# Patient Record
Sex: Female | Born: 1955 | Race: White | Hispanic: No | Marital: Single | Smoking: Never smoker
Health system: Southern US, Community
[De-identification: ages and names within clinical notes are randomized; demographics above are authoritative.]

## PROBLEM LIST (undated history)

## (undated) DIAGNOSIS — J45909 Unspecified asthma, uncomplicated: Secondary | ICD-10-CM

## (undated) HISTORY — PX: APPENDECTOMY: SHX54

## (undated) HISTORY — PX: ABDOMINAL HYSTERECTOMY: SHX81

## (undated) HISTORY — PX: TONSILLECTOMY: SUR1361

---

## 2016-07-31 ENCOUNTER — Ambulatory Visit
Admission: EM | Admit: 2016-07-31 | Discharge: 2016-07-31 | Disposition: A | Discharge status: Home / Self Care | Payer: 59 | Attending: Family Medicine | Admitting: Family Medicine

## 2016-07-31 DIAGNOSIS — J4521 Mild intermittent asthma with (acute) exacerbation: Secondary | ICD-10-CM

## 2016-07-31 HISTORY — DX: Unspecified asthma, uncomplicated: J45.909

## 2016-07-31 MED ORDER — DOXYCYCLINE HYCLATE 100 MG PO TABS: 100.0000 mg | ORAL_TABLET | Freq: Two times a day (BID) | ORAL | 0 refills | Status: DC

## 2016-07-31 MED ORDER — PREDNISONE 20 MG PO TABS: ORAL_TABLET | ORAL | 0 refills | Status: DC

## 2016-07-31 NOTE — ED Provider Notes (Signed)
[facility]    CSN: [id number] Arrival date & time: 07/31/16  1833     History   Chief Complaint Chief Complaint  Patient presents with  . Cough    HPI Faith Huynh is a 61 y.o. female.   The history is provided by the patient.  Cough  Associated symptoms: wheezing   URI  Presenting symptoms: congestion and cough   Severity:  Moderate Onset quality:  Sudden Timing:  Constant Chronicity:  New Relieved by:  Nothing Ineffective treatments:  Prescription medications and OTC medications Associated symptoms: wheezing   Risk factors: chronic respiratory disease (asthma)   Risk factors: not elderly, no chronic cardiac disease, no chronic kidney disease, no diabetes mellitus, no immunosuppression, no recent illness, no recent travel and no sick contacts     Past Medical History:  Diagnosis Date  . Asthma     There are no active problems to display for this patient.   Past Surgical History:  Procedure Laterality Date  . ABDOMINAL HYSTERECTOMY    . APPENDECTOMY    . TONSILLECTOMY      OB History    No data available       Home Medications    Prior to Admission medications   Medication Sig Start Date End Date Taking? Authorizing Provider  albuterol (PROVENTIL HFA;VENTOLIN HFA) 108 (90 Base) MCG/ACT inhaler Inhale into the lungs every 6 (six) hours as needed for wheezing or shortness of breath.   Yes Historical Provider, MD  budesonide (PULMICORT) 180 MCG/ACT inhaler Inhale into the lungs 2 (two) times daily.   Yes Historical Provider, MD  estradiol (ESTRACE) 0.5 MG tablet Take 0.5 mg by mouth daily.   Yes Historical Provider, MD  fluticasone (FLONASE) 50 MCG/ACT nasal spray Place into both nostrils daily.   Yes Historical Provider, MD  ranitidine (ZANTAC) 150 MG tablet Take 150 mg by mouth 2 (two) times daily.   Yes Historical Provider, MD  doxycycline (VIBRA-TABS) 100 MG tablet Take 1 tablet (100 mg total) by mouth 2 (two) times daily. 07/31/16   Faith Mccallum, MD  predniSONE (DELTASONE) 20 MG tablet 3 tabs po qd for 2 days, then 2 tabs po qd for 3 days, then 1 tab po qd for 3 days, then half a tab po qd for 2 days 07/31/16   Faith Mccallum, MD    Family History History reviewed. No pertinent family history.  Social History Social History  Substance Use Topics  . Smoking status: Never Smoker  . Smokeless tobacco: Never Used  . Alcohol use Yes     Comment: social     Allergies   Biaxin [clarithromycin]; Sulfa antibiotics; and Erythromycin   Review of Systems Review of Systems  HENT: Positive for congestion.   Respiratory: Positive for cough and wheezing.      Physical Exam Triage Vital Signs ED Triage Vitals  Enc Vitals Group     BP 07/31/16 1917 (!) 167/90     Pulse Rate 07/31/16 1917 90     Resp 07/31/16 1917 18     Temp 07/31/16 1917 98.9 F (37.2 C)     Temp Source 07/31/16 1917 Oral     SpO2 07/31/16 1917 100 %     Weight 07/31/16 1918 145 lb (65.8 kg)     Height 07/31/16 1918 5\' 5"  (1.651 m)     Head Circumference --      Peak Flow --      Pain Score 07/31/16 1922 5  Pain Loc --      Pain Edu? --      Excl. in GC? --    No data found.   Updated Vital Signs BP (!) 167/90 (BP Location: Left Arm)   Pulse 90   Temp 98.9 F (37.2 C) (Oral)   Resp 18   Ht 5\' 5"  (1.651 m)   Wt 145 lb (65.8 kg)   SpO2 100%   BMI 24.13 kg/m   Visual Acuity Right Eye Distance:   Left Eye Distance:   Bilateral Distance:    Right Eye Near:   Left Eye Near:    Bilateral Near:     Physical Exam  Constitutional: She appears well-developed and well-nourished. No distress.  HENT:  Head: Normocephalic and atraumatic.  Right Ear: Tympanic membrane, external ear and ear canal normal.  Left Ear: Tympanic membrane, external ear and ear canal normal.  Nose: Mucosal edema and rhinorrhea present. No nose lacerations, sinus tenderness, nasal deformity, septal deviation or nasal septal hematoma. No epistaxis.  No foreign  bodies. Right sinus exhibits no maxillary sinus tenderness and no frontal sinus tenderness. Left sinus exhibits no maxillary sinus tenderness and no frontal sinus tenderness.  Mouth/Throat: Uvula is midline, oropharynx is clear and moist and mucous membranes are normal. No oropharyngeal exudate.  Eyes: Conjunctivae and EOM are normal. Pupils are equal, round, and reactive to light. Right eye exhibits no discharge. Left eye exhibits no discharge. No scleral icterus.  Neck: Normal range of motion. Neck supple. No thyromegaly present.  Cardiovascular: Normal rate, regular rhythm and normal heart sounds.   Pulmonary/Chest: Effort normal. No respiratory distress. She has wheezes (few, diffuse and rhonchi bilaterally). She has no rales.  Lymphadenopathy:    She has no cervical adenopathy.  Skin: She is not diaphoretic.  Nursing note and vitals reviewed.    UC Treatments / Results  Labs (all labs ordered are listed, but only abnormal results are displayed) Labs Reviewed - No data to display  EKG  EKG Interpretation None       Radiology No results found.  Procedures Procedures (including critical care time)  Medications Ordered in UC Medications - No data to display   Initial Impression / Assessment and Plan / UC Course  I have reviewed the triage vital signs and the nursing notes.  Pertinent labs & imaging results that were available during my care of the patient were reviewed by me and considered in my medical decision making (see chart for details).  Clinical Course       Final Clinical Impressions(s) / UC Diagnoses   Final diagnoses:  Mild intermittent asthmatic bronchitis with acute exacerbation    New Prescriptions Discharge Medication List as of 07/31/2016  8:01 PM    START taking these medications   Details  doxycycline (VIBRA-TABS) 100 MG tablet Take 1 tablet (100 mg total) by mouth 2 (two) times daily., Starting Wed 07/31/2016, Normal    predniSONE (DELTASONE)  20 MG tablet 3 tabs po qd for 2 days, then 2 tabs po qd for 3 days, then 1 tab po qd for 3 days, then half a tab po qd for 2 days, Normal       1. diagnosis reviewed with patient 2. rx as per orders above; reviewed possible side effects, interactions, risks and benefits  3. Recommend supportive treatment with rest, fluids; continue current home inhalers 4. Follow-up prn if symptoms worsen or don't improve   Faith Mccallumrlando Jorgina Binning, MD 07/31/16 2108

## 2016-07-31 NOTE — ED Triage Notes (Addendum)
For past week has had cough productive of green phlegm, laryngitis, drainage down back of throat, nasal and chest congestion. No fever. No sore throat. Has taken some Amoxicillin for past few days that she had left over from a previous prescription but not improving.

## 2017-03-09 ENCOUNTER — Ambulatory Visit
Admission: EM | Admit: 2017-03-09 | Discharge: 2017-03-09 | Disposition: A | Discharge status: Home / Self Care | Payer: 59 | Attending: Family Medicine | Admitting: Family Medicine

## 2017-03-09 DIAGNOSIS — R42 Dizziness and giddiness: Secondary | ICD-10-CM | POA: Diagnosis not present

## 2017-03-09 DIAGNOSIS — R51 Headache: Secondary | ICD-10-CM | POA: Diagnosis not present

## 2017-03-09 DIAGNOSIS — H6982 Other specified disorders of Eustachian tube, left ear: Secondary | ICD-10-CM

## 2017-03-09 MED ORDER — AMOXICILLIN-POT CLAVULANATE 875-125 MG PO TABS: 1.0000 | ORAL_TABLET | Freq: Two times a day (BID) | ORAL | 0 refills | Status: DC

## 2017-03-09 NOTE — ED Triage Notes (Addendum)
As per patient felt dizzy and off balance has hx of vertigo had Left ear pain sinusitis nasal congestion onset week ago got improved after sudafed and  sinus allergy Advil but getting worst from past 2 days now felt ike little dehydrated but drinking enough water.

## 2017-03-09 NOTE — ED Provider Notes (Signed)
[facility]    CSN: [id number] Arrival date & time: 03/09/17  1137     History   Chief Complaint Chief Complaint  Patient presents with  . Weakness    HPI Faith PollackRachel Huynh is a 61 y.o. female.   HPI  This a 61 year old female who presented 10 days ago had ear pain sinusitis, nasal congestion, began just prior to the [place] [facility] [place] she started feeling dizzy and off-balance. She started using Sudafed and sinus allergy Advil and improved. She returned on flight back to this area and now past 2 days has felt the dizziness and also issues with "fluid" in left ear  With pain. She is likely slightly dehydrated because [place]. She has had no fever or chills. She does have sinus pain and tenderness.          Past Medical History:  Diagnosis Date  . Asthma     There are no active problems to display for this patient.   Past Surgical History:  Procedure Laterality Date  . ABDOMINAL HYSTERECTOMY    . APPENDECTOMY    . TONSILLECTOMY      OB History    No data available       Home Medications    Prior to Admission medications   Medication Sig Start Date End Date Taking? Authorizing Provider  albuterol (PROVENTIL HFA;VENTOLIN HFA) 108 (90 Base) MCG/ACT inhaler Inhale into the lungs every 6 (six) hours as needed for wheezing or shortness of breath.    [provider]  amoxicillin-clavulanate (AUGMENTIN) 875-125 MG tablet Take 1 tablet by mouth every 12 (twelve) hours. 03/09/17   Lutricia Feiloemer, Chellie Vanlue P, PA-C  budesonide (PULMICORT) 180 MCG/ACT inhaler Inhale into the lungs 2 (two) times daily.    [provider]  doxycycline (VIBRA-TABS) 100 MG tablet Take 1 tablet (100 mg total) by mouth 2 (two) times daily. 07/31/16   Payton Mccallumonty, [place], MD  estradiol (ESTRACE) 0.5 MG tablet Take 0.5 mg by mouth daily.    [provider]  fluticasone (FLONASE) 50 MCG/ACT nasal spray Place into both nostrils daily.     [provider]  predniSONE (DELTASONE) 20 MG tablet 3 tabs po qd for 2 days, then 2 tabs po qd for 3 days, then 1 tab po qd for 3 days, then half a tab po qd for 2 days 07/31/16   Payton Mccallumonty, [place], MD  ranitidine (ZANTAC) 150 MG tablet Take 150 mg by mouth 2 (two) times daily.    [provider]    Family History No family history on file.  Social History Social History  Substance Use Topics  . Smoking status: Never Smoker  . Smokeless tobacco: Never Used  . Alcohol use Yes     Comment: social     Allergies   Biaxin [clarithromycin]; Sulfa antibiotics; and Erythromycin   Review of Systems Review of Systems  Constitutional: Positive for activity change. Negative for chills, fatigue and fever.  HENT: Positive for congestion, postnasal drip, rhinorrhea, sinus pain and sinus pressure.   Neurological: Positive for dizziness and headaches.  All other systems reviewed and are negative.    Physical Exam Triage Vital Signs ED Triage Vitals [03/09/17 1203]  Enc Vitals Group     BP (!) 144/83     Pulse Rate 82     Resp 16     Temp 97.9 F (36.6 C)     Temp Source Oral  SpO2 100 %     Weight 153 lb (69.4 kg)     Height 5\' 5"  (1.651 m)     Head Circumference      Peak Flow      Pain Score      Pain Loc      Pain Edu?      Excl. in GC?    Orthostatic VS for the past 24 hrs:  BP- Lying Pulse- Lying BP- Sitting Pulse- Sitting BP- Standing at 0 minutes Pulse- Standing at 0 minutes  03/09/17 1313 141/71 71 138/76 77 132/79 85    Updated Vital Signs BP (!) 144/83 (BP Location: Right Arm)   Pulse 82   Temp 97.9 F (36.6 C) (Oral)   Resp 16   Ht 5\' 5"  (1.651 m)   Wt 153 lb (69.4 kg)   SpO2 100%   BMI 25.46 kg/m   Visual Acuity Right Eye Distance:   Left Eye Distance:   Bilateral Distance:    Right Eye Near:   Left Eye Near:    Bilateral Near:     Physical Exam  Constitutional: She is oriented to person, place, and time. She appears  well-developed and well-nourished. No distress.  HENT:  Head: Normocephalic.  Nose: Nose normal.  Mouth/Throat: Oropharynx is clear and moist. No oropharyngeal exudate.  Left ear is dull . Right ear is impacted with cerumen  Eyes: Pupils are equal, round, and reactive to light. Right eye exhibits no discharge. Left eye exhibits no discharge.  Neck: Normal range of motion.  Pulmonary/Chest: Effort normal and breath sounds normal.  Musculoskeletal: Normal range of motion.  Lymphadenopathy:    She has no cervical adenopathy.  Neurological: She is alert and oriented to person, place, and time.  Skin: Skin is warm and dry. She is not diaphoretic.  Psychiatric: She has a normal mood and affect. Her behavior is normal. Judgment and thought content normal.  Nursing note and vitals reviewed.    UC Treatments / Results  Labs (all labs ordered are listed, but only abnormal results are displayed) Labs Reviewed - No data to display  EKG  EKG Interpretation None       Radiology No results found.  Procedures Procedures (including critical care time)  Medications Ordered in UC Medications - No data to display   Initial Impression / Assessment and Plan / UC Course  I have reviewed the triage vital signs and the nursing notes.  Pertinent labs & imaging results that were available during my care of the patient were reviewed by me and considered in my medical decision making (see chart for details). Plan: 1. Test/x-ray results and diagnosis reviewed with patient 2. rx as per orders; risks, benefits, potential side effects reviewed with patient 3. Recommend supportive treatment with continued use of Flonase. Increase fluid intake. Will treat with Augmentin because of her previous sinus symptoms with the eustachian tube dysfunction she has currently and the significant tenderness she has to percussion. She's not improving will provide her with the name of an ENT that she may follow-up  with. 4. F/u prn if symptoms worsen or don't improve       Final Clinical Impressions(s) / UC Diagnoses   Final diagnoses:  Eustachian tube dysfunction, left    New Prescriptions New Prescriptions   AMOXICILLIN-CLAVULANATE (AUGMENTIN) 875-125 MG TABLET    Take 1 tablet by mouth every 12 (twelve) hours.     Controlled Substance Prescriptions [place] Controlled Substance Registry consulted? Not Applicable  Lutricia Feil, PA-C 03/09/17 1318

## 2019-12-17 ENCOUNTER — Ambulatory Visit: Payer: Self-pay | Attending: Internal Medicine

## 2019-12-17 DIAGNOSIS — Z23 Encounter for immunization: Secondary | ICD-10-CM

## 2019-12-17 NOTE — Progress Notes (Signed)
   Covid-19 Vaccination Clinic  Name:  Faith Huynh    MRN: [id number] DOB: Dec 19, 1955  12/17/2019  Faith Huynh was observed post Covid-19 immunization for 30 minutes based on pre-vaccination screening without incident. She was provided with Vaccine Information Sheet and instruction to access the V-Safe system.   Faith Huynh was instructed to call 911 with any severe reactions post vaccine: Marland Kitchen Difficulty breathing  . Swelling of face and throat  . A fast heartbeat  . A bad rash all over body  . Dizziness and weakness   Immunizations Administered    Name Date Dose VIS Date Route   Pfizer COVID-19 Vaccine 12/17/2019  9:00 AM 0.3 mL 09/22/2018 Intramuscular   Manufacturer: [employer], [employer]   Lot: [id number]   NDC: [address]-5

## 2020-01-07 ENCOUNTER — Ambulatory Visit: Payer: Self-pay | Attending: Internal Medicine

## 2020-01-07 DIAGNOSIS — Z23 Encounter for immunization: Secondary | ICD-10-CM

## 2020-01-07 NOTE — Progress Notes (Signed)
° °  Covid-19 Vaccination Clinic  Name:  Jaileigh Weimer    MRN: [id number] DOB: 1955-09-13  01/07/2020  Ms. Byers was observed post Covid-19 immunization for 15 minutes without incident. She was provided with Vaccine Information Sheet and instruction to access the V-Safe system.   Ms. Darr was instructed to call 911 with any severe reactions post vaccine:  Difficulty breathing   Swelling of face and throat   A fast heartbeat   A bad rash all over body   Dizziness and weakness   Immunizations Administered    Name Date Dose VIS Date Route   Pfizer COVID-19 Vaccine 01/07/2020  8:30 AM 0.3 mL 09/22/2018 Intramuscular   Manufacturer: [employer], [employer]   Lot: [id number]   NDC: [address]-7

## 2020-08-20 ENCOUNTER — Ambulatory Visit (INDEPENDENT_AMBULATORY_CARE_PROVIDER_SITE_OTHER): Payer: Managed Care, Other (non HMO)

## 2020-08-20 ENCOUNTER — Other Ambulatory Visit: Payer: Self-pay

## 2020-08-20 ENCOUNTER — Encounter: Payer: Self-pay | Admitting: Emergency Medicine

## 2020-08-20 ENCOUNTER — Ambulatory Visit
Admission: EM | Admit: 2020-08-20 | Discharge: 2020-08-20 | Disposition: A | Discharge status: Home / Self Care | Payer: Managed Care, Other (non HMO)

## 2020-08-20 DIAGNOSIS — M25551 Pain in right hip: Secondary | ICD-10-CM

## 2020-08-20 DIAGNOSIS — M62838 Other muscle spasm: Secondary | ICD-10-CM

## 2020-08-20 DIAGNOSIS — W19XXXA Unspecified fall, initial encounter: Secondary | ICD-10-CM | POA: Diagnosis not present

## 2020-08-20 DIAGNOSIS — T148XXA Other injury of unspecified body region, initial encounter: Secondary | ICD-10-CM

## 2020-08-20 MED ORDER — METHOCARBAMOL 750 MG PO TABS: 750.0000 mg | ORAL_TABLET | Freq: Three times a day (TID) | ORAL | 0 refills | Status: AC | PRN

## 2020-08-20 NOTE — ED Triage Notes (Signed)
Patient states that she slipped and fell on her step on Friday.  Patient c/o pain in her right buttock and hip.  Patient denies hitting her head.

## 2020-08-20 NOTE — Discharge Instructions (Signed)
The x-ray of your hip is normal today.  You do have a very bad bruise which is consistent with a traumatic hematoma.  You should ice this area every couple of hours for the first 3 days and then switch to use of heat.  You can use a heating pad.  This will help the blood to resorb.  He can continue taking ibuprofen and add Tylenol for pain relief.  I have also sent some methocarbamol since you do appear to be having muscle spasms in the area and it is already been helping you.  Follow-up with our department as needed for any new or worsening symptoms.

## 2020-08-20 NOTE — ED Provider Notes (Signed)
[facility]    CSN: [id number] Arrival date & time: 08/20/20  1230      History   Chief Complaint Chief Complaint  Patient presents with  . Fall  . Hip Pain    right    HPI Faith Huynh is a 65 y.o. female presenting for pain of the right buttocks and hip following a fall 2 days ago.  Patient states that she slipped on her icy stairs and fell down about 2-3 stairs and landed on her right buttocks.  Patient says she has a very large bruise on her right butt cheek.  She says she has been icing the area and taking ibuprofen which has helped decrease the swelling in the area, but she is concerned that something could possibly be "cracked."  Patient also admits that she is dealing with a herniated disc in her back and sees a chiropractor for this.  She states she has some methocarbamol that she has been taking which does seem to help muscle spasms she is having in the area.  Patient says she is now out of this medication.  She denies any head injury or loss of consciousness.  No bleeding disorders.  No other injuries, complaints or concerns.  HPI  Past Medical History:  Diagnosis Date  . Asthma     There are no problems to display for this patient.   Past Surgical History:  Procedure Laterality Date  . ABDOMINAL HYSTERECTOMY    . APPENDECTOMY    . TONSILLECTOMY      OB History   No obstetric history on file.      Home Medications    Prior to Admission medications   Medication Sig Start Date End Date Taking? Authorizing Provider  albuterol (PROVENTIL HFA;VENTOLIN HFA) 108 (90 Base) MCG/ACT inhaler Inhale into the lungs every 6 (six) hours as needed for wheezing or shortness of breath.   Yes [provider]  BREO ELLIPTA 100-25 MCG/INH AEPB Inhale 1 puff into the lungs daily. 07/13/20  Yes [provider]  estradiol (ESTRACE) 0.5 MG tablet Take 0.5 mg by mouth daily.   Yes [provider]  fluticasone (FLONASE) 50 MCG/ACT nasal spray  Place into both nostrils daily.   Yes [provider]  methocarbamol (ROBAXIN) 750 MG tablet Take 1 tablet (750 mg total) by mouth every 8 (eight) hours as needed for up to 10 days for muscle spasms. 08/20/20 08/30/20 Yes Eusebio Friendly B, PA-C  amoxicillin-clavulanate (AUGMENTIN) 875-125 MG tablet Take 1 tablet by mouth every 12 (twelve) hours. 03/09/17   Lutricia Feil, PA-C  budesonide (PULMICORT) 180 MCG/ACT inhaler Inhale into the lungs 2 (two) times daily.    [provider]  doxycycline (VIBRA-TABS) 100 MG tablet Take 1 tablet (100 mg total) by mouth 2 (two) times daily. 07/31/16   [name], MD  predniSONE (DELTASONE) 20 MG tablet 3 tabs po qd for 2 days, then 2 tabs po qd for 3 days, then 1 tab po qd for 3 days, then half a tab po qd for 2 days 07/31/16   [name], MD  ranitidine (ZANTAC) 150 MG tablet Take 150 mg by mouth 2 (two) times daily.    [provider]    Family History History reviewed. No pertinent family history.  Social History Social History   Tobacco Use  . Smoking status: Never Smoker  . Smokeless tobacco: Never Used  Vaping Use  . Vaping Use: Never used  Substance Use Topics  .  Alcohol use: Yes    Comment: social  . Drug use: No     Allergies   Biaxin [clarithromycin], Sulfa antibiotics, and Erythromycin   Review of Systems Review of Systems  Constitutional: Negative for fatigue.  Musculoskeletal: Positive for arthralgias, back pain (chronic) and myalgias. Negative for joint swelling.  Skin: Positive for color change. Negative for rash and wound.  Neurological: Negative for dizziness, syncope, weakness, numbness and headaches.  Hematological: Does not bruise/bleed easily.     Physical Exam Triage Vital Signs ED Triage Vitals  Enc Vitals Group     BP 08/20/20 1243 (!) 153/76     Pulse Rate 08/20/20 1243 98     Resp 08/20/20 1243 14     Temp 08/20/20 1243 98.1 F (36.7 C)     Temp Source 08/20/20 1243 Oral      SpO2 08/20/20 1243 99 %     Weight 08/20/20 1240 160 lb (72.6 kg)     Height 08/20/20 1240 5\' 5"  (1.651 m)     Head Circumference --      Peak Flow --      Pain Score 08/20/20 1239 5     Pain Loc --      Pain Edu? --      Excl. in GC? --    No data found.  Updated Vital Signs BP (!) 153/76 (BP Location: Right Arm)   Pulse 98   Temp 98.1 F (36.7 C) (Oral)   Resp 14   Ht 5\' 5"  (1.651 m)   Wt 160 lb (72.6 kg)   SpO2 99%   BMI 26.63 kg/m       Physical Exam Vitals and nursing note reviewed.  Constitutional:      General: She is not in acute distress.    Appearance: Normal appearance. She is not ill-appearing or toxic-appearing.  HENT:     Head: Normocephalic and atraumatic.  Eyes:     General: No scleral icterus.       Right eye: No discharge.        Left eye: No discharge.     Conjunctiva/sclera: Conjunctivae normal.  Cardiovascular:     Rate and Rhythm: Normal rate and regular rhythm.     Heart sounds: Normal heart sounds.  Pulmonary:     Effort: Pulmonary effort is normal. No respiratory distress.     Breath sounds: Normal breath sounds.  Musculoskeletal:        General: Tenderness (TTP L5-S1) present.     Cervical back: Neck supple.  Skin:    General: Skin is dry.     Findings: Ecchymosis (diffuse ecchymosis of right buttocks. There is an area of induration noted that is tender to palpation. No bony tenderness) present.  Neurological:     General: No focal deficit present.     Mental Status: She is alert. Mental status is at baseline.     Motor: No weakness.     Gait: Gait normal.  Psychiatric:        Mood and Affect: Mood normal.        Behavior: Behavior normal.        Thought Content: Thought content normal.      UC Treatments / Results  Labs (all labs ordered are listed, but only abnormal results are displayed) Labs Reviewed - No data to display  EKG   Radiology DG Hip Unilat W or Wo Pelvis 2-3 Views Right  Result Date:  08/20/2020 CLINICAL DATA:  Status post fall  with right hip pain. EXAM: DG HIP (WITH OR WITHOUT PELVIS) 2-3V RIGHT COMPARISON:  None. FINDINGS: There is no evidence of hip fracture or dislocation. There is no evidence of arthropathy or other focal bone abnormality. IMPRESSION: Negative. Electronically Signed   By: Sherian Rein M.D.   On: 08/20/2020 13:20    Procedures Procedures (including critical care time)  Medications Ordered in UC Medications - No data to display  Initial Impression / Assessment and Plan / UC Course  I have reviewed the triage vital signs and the nursing notes.  Pertinent labs & imaging results that were available during my care of the patient were reviewed by me and considered in my medical decision making (see chart for details).   Imaging of head negative for fracture.  Advised patient she has a traumatic hematoma and suggested cryotherapy and also use of heating pad.  Advised continuing ibuprofen and starting Tylenol for pain relief.  I have refilled the methocarbamol since it is helping.  Advised to follow-up with chiropractor for her back issue if that does help her.  If it does not, I would recommend seeing orthopedics or physical therapy.  Return and ED precautions reviewed.   Final Clinical Impressions(s) / UC Diagnoses   Final diagnoses:  Traumatic hematoma  Right hip pain  Fall, initial encounter  Muscle spasm     Discharge Instructions     The x-ray of your hip is normal today.  You do have a very bad bruise which is consistent with a traumatic hematoma.  You should ice this area every couple of hours for the first 3 days and then switch to use of heat.  You can use a heating pad.  This will help the blood to resorb.  He can continue taking ibuprofen and add Tylenol for pain relief.  I have also sent some methocarbamol since you do appear to be having muscle spasms in the area and it is already been helping you.  Follow-up with our department as needed  for any new or worsening symptoms.   ED Prescriptions    Medication Sig Dispense Auth. Provider   methocarbamol (ROBAXIN) 750 MG tablet Take 1 tablet (750 mg total) by mouth every 8 (eight) hours as needed for up to 10 days for muscle spasms. 20 tablet Shirlee Latch, PA-C     I have reviewed the PDMP during this encounter.   Shirlee Latch, PA-C 08/20/20 1358

## 2021-04-12 ENCOUNTER — Other Ambulatory Visit: Payer: Self-pay

## 2021-04-12 ENCOUNTER — Emergency Department: Payer: Managed Care, Other (non HMO)

## 2021-04-12 ENCOUNTER — Emergency Department
Admission: EM | Admit: 2021-04-12 | Discharge: 2021-04-13 | Disposition: A | Discharge status: Home / Self Care | Payer: Managed Care, Other (non HMO) | Attending: Emergency Medicine | Admitting: Emergency Medicine

## 2021-04-12 DIAGNOSIS — Y9389 Activity, other specified: Secondary | ICD-10-CM | POA: Insufficient documentation

## 2021-04-12 DIAGNOSIS — S42111A Displaced fracture of body of scapula, right shoulder, initial encounter for closed fracture: Secondary | ICD-10-CM | POA: Diagnosis not present

## 2021-04-12 DIAGNOSIS — R519 Headache, unspecified: Secondary | ICD-10-CM | POA: Insufficient documentation

## 2021-04-12 DIAGNOSIS — S7001XA Contusion of right hip, initial encounter: Secondary | ICD-10-CM | POA: Diagnosis not present

## 2021-04-12 DIAGNOSIS — S4991XA Unspecified injury of right shoulder and upper arm, initial encounter: Secondary | ICD-10-CM | POA: Diagnosis present

## 2021-04-12 DIAGNOSIS — W19XXXA Unspecified fall, initial encounter: Secondary | ICD-10-CM

## 2021-04-12 DIAGNOSIS — J45909 Unspecified asthma, uncomplicated: Secondary | ICD-10-CM | POA: Insufficient documentation

## 2021-04-12 DIAGNOSIS — Z7951 Long term (current) use of inhaled steroids: Secondary | ICD-10-CM | POA: Diagnosis not present

## 2021-04-12 DIAGNOSIS — W108XXA Fall (on) (from) other stairs and steps, initial encounter: Secondary | ICD-10-CM | POA: Diagnosis not present

## 2021-04-12 DIAGNOSIS — S42101A Fracture of unspecified part of scapula, right shoulder, initial encounter for closed fracture: Secondary | ICD-10-CM

## 2021-04-12 MED ORDER — HYDROCODONE-ACETAMINOPHEN 5-325 MG PO TABS: 1.0000 | ORAL_TABLET | Freq: Four times a day (QID) | ORAL | 0 refills | Status: DC | PRN

## 2021-04-12 MED ORDER — MORPHINE SULFATE (PF) 4 MG/ML IV SOLN
4.0000 mg | Freq: Once | INTRAVENOUS | Status: AC
  Administered 2021-04-12: 4 mg via INTRAVENOUS
  Filled 2021-04-12: qty 1

## 2021-04-12 MED ORDER — ONDANSETRON HCL 4 MG/2ML IJ SOLN
4.0000 mg | Freq: Once | INTRAMUSCULAR | Status: AC
  Administered 2021-04-12: 4 mg via INTRAVENOUS
  Filled 2021-04-12: qty 2

## 2021-04-12 NOTE — ED Triage Notes (Signed)
Pt was outside and states was distracted and fell down 3 steps. Pt denies any loc, states did hit head. No blood thinners, denies any neck pain. Pt co right lower back pain, right shoulder and right elbow pain.

## 2021-04-12 NOTE — Discharge Instructions (Addendum)
You broke your scapula or shoulder blade.  Wear the sling.  You can use Vicodin 1 or 2 pills every 6 hours as needed for pain.  If the pain is not very bad try Tylenol or Motrin.  Do not take Tylenol and Vicodin together as there is Tylenol and Vicodin and you can get too much Tylenol.  Move your shoulder gently as I described to you.  Give Dr. Odis Luster office a call in the morning.  Let them know you have a fractured shoulder blade.  They should get you an appointment within a week or 2.  If you have any increasing pain or swelling or numbness or tingling of the hand or arm please come here.

## 2021-04-12 NOTE — ED Notes (Signed)
While in xray pt became diaphoretic and dizzy, bp 78/48 pt taken to room.

## 2021-04-12 NOTE — ED Provider Notes (Signed)
[facility] Emergency Department Provider Note   ____________________________________________   Event Date/Time   First MD Initiated Contact with Patient 04/12/21 2100     (approximate)  I have reviewed the triage vital signs and the nursing notes.   HISTORY  Chief Complaint Fall   HPI Faith Huynh is a 65 y.o. female patient reports she went outside look at the stars and fell off the deck by accident.  She landed on her shoulder.  She also hit her hip.  She also hit her head.  She did not pass out.  Most of the pain now is in the back of her right shoulder. She is not having any nausea or vomiting.  Neck is somewhat sore.  She did have a headache.  It was not severe.  Patient was able to walk after the fall but she had a lot of pain in her hip.      Past Medical History:  Diagnosis Date   Asthma     There are no problems to display for this patient.   Past Surgical History:  Procedure Laterality Date   ABDOMINAL HYSTERECTOMY     APPENDECTOMY     TONSILLECTOMY      Prior to Admission medications   Medication Sig Start Date End Date Taking? Authorizing Provider  HYDROcodone-acetaminophen (NORCO/VICODIN) 5-325 MG tablet Take 1 tablet by mouth every 6 (six) hours as needed for moderate pain. 04/12/21  Yes Arnaldo Natal, MD  albuterol (PROVENTIL HFA;VENTOLIN HFA) 108 (90 Base) MCG/ACT inhaler Inhale into the lungs every 6 (six) hours as needed for wheezing or shortness of breath.    [provider]  amoxicillin-clavulanate (AUGMENTIN) 875-125 MG tablet Take 1 tablet by mouth every 12 (twelve) hours. Patient not taking: Reported on 04/12/2021 03/09/17   Ovid Curd P, PA-C  BREO ELLIPTA 100-25 MCG/INH AEPB Inhale 1 puff into the lungs daily. 07/13/20   [provider]  budesonide (PULMICORT) 180 MCG/ACT inhaler Inhale into the lungs 2 (two) times daily.    [provider]  doxycycline (VIBRA-TABS) 100 MG tablet Take 1  tablet (100 mg total) by mouth 2 (two) times daily. Patient not taking: Reported on 04/12/2021 07/31/16   [name], MD  estradiol (ESTRACE) 0.5 MG tablet Take 0.5 mg by mouth daily.    [provider]  fluticasone (FLONASE) 50 MCG/ACT nasal spray Place into both nostrils daily.    [provider]  predniSONE (DELTASONE) 20 MG tablet 3 tabs po qd for 2 days, then 2 tabs po qd for 3 days, then 1 tab po qd for 3 days, then half a tab po qd for 2 days 07/31/16   [name], MD  ranitidine (ZANTAC) 150 MG tablet Take 150 mg by mouth 2 (two) times daily.    [provider]    Allergies Biaxin [clarithromycin], Sulfa antibiotics, and Erythromycin  No family history on file.  Social History Social History   Tobacco Use   Smoking status: Never   Smokeless tobacco: Never  Vaping Use   Vaping Use: Never used  Substance Use Topics   Alcohol use: Yes    Comment: social   Drug use: No    Review of Systems  Constitutional: No fever/chills Eyes: No visual changes. ENT: No sore throat. Cardiovascular: Denies chest pain. Respiratory: Denies shortness of breath. Gastrointestinal: No abdominal pain.  No nausea, no vomiting.  No diarrhea.  No constipation. Genitourinary: Negative for dysuria. Musculoskeletal: Negative for back pain. Skin:  Negative for rash. Neurological: Negative for headaches, focal weakness  ____________________________________________   PHYSICAL EXAM:  VITAL SIGNS: ED Triage Vitals [04/12/21 1958]  Enc Vitals Group     BP (!) 166/91     Pulse Rate 72     Resp 20     Temp 98.2 F (36.8 C)     Temp Source Oral     SpO2 100 %     Weight 160 lb (72.6 kg)     Height 5\' 5"  (1.651 m)     Head Circumference      Peak Flow      Pain Score 10     Pain Loc      Pain Edu?      Excl. in GC?     Constitutional: Alert and oriented. Well appearing and in no acute distress. Eyes: Conjunctivae are normal. PER. EOMI. Head:  Atraumatic. Nose: No congestion/rhinnorhea. Mouth/Throat: Mucous membranes are moist.  Oropharynx non-erythematous. Neck: No stridor.   Cardiovascular: Normal rate, regular rhythm. Grossly normal heart sounds.  Good peripheral circulation. Respiratory: Normal respiratory effort.  No retractions. Lungs CTAB. Gastrointestinal: Soft and nontender. No distention. No abdominal bruits.  Musculoskeletal: No lower extremity tenderness nor edema.  There is pain on palpation posterior to the patient's right hip.  There is also pain on palpation of the patient's right shoulder especially over the scapula. Neurologic:  Normal speech and language. No gross focal neurologic deficits are appreciated.  Skin:  Skin is warm, dry and intact. No rash noted.   ____________________________________________   LABS (all labs ordered are listed, but only abnormal results are displayed)  Labs Reviewed - No data to display ____________________________________________  EKG   ____________________________________________  RADIOLOGY , personally viewed and evaluated these images (plain radiographs) as part of my medical decision making, as well as reviewing the written report by the radiologist.  ED MD interpretation: X-ray of the elbow and shoulder read by radiology reviewed by me show no acute pathology this is also true of the right hip.  CT of the head and neck also read by radiology reviewed by me show no acute pathology.  Official radiology report(s): DG Shoulder Right  Result Date: 04/12/2021 CLINICAL DATA:  Fall down 3 steps today.  Right shoulder pain. EXAM: RIGHT SHOULDER - 2+ VIEW COMPARISON:  None. FINDINGS: Mildly displaced fracture is seen through the body of the scapula. No other fractures are identified, and there is no evidence of shoulder dislocation. IMPRESSION: Mildly displaced scapular fracture. No evidence of shoulder dislocation. Electronically Signed   By: 04/14/2021  M.D.   On: 04/12/2021 20:35   DG Elbow Complete Right  Result Date: 04/12/2021 CLINICAL DATA:  Fall down 3 steps today. Right elbow pain. Initial encounter. EXAM: RIGHT ELBOW - COMPLETE 3+ VIEW COMPARISON:  None. FINDINGS: There is no evidence of fracture, dislocation, or joint effusion. There is no evidence of arthropathy or other focal bone abnormality. Soft tissues are unremarkable. IMPRESSION: Negative. Electronically Signed   By: 04/14/2021 M.D.   On: 04/12/2021 20:36   CT HEAD WO CONTRAST (04/14/2021)  Result Date: 04/12/2021 CLINICAL DATA:  04/14/2021 down steps EXAM: CT HEAD WITHOUT CONTRAST TECHNIQUE: Contiguous axial images were obtained from the base of the skull through the vertex without intravenous contrast. COMPARISON:  None. FINDINGS: Brain: No evidence of acute infarction, hemorrhage, hydrocephalus, extra-axial collection or mass lesion/mass effect. Vascular: No hyperdense vessel or unexpected calcification. Skull: Normal. Negative for fracture or focal  lesion. Sinuses/Orbits: No acute finding. Other: None IMPRESSION: Negative non contrasted CT appearance of the brain Electronically Signed   By: Jasmine Pang M.D.   On: 04/12/2021 20:59   CT Cervical Spine Wo Contrast  Result Date: 04/12/2021 CLINICAL DATA:  Larey Seat, right scapular fracture EXAM: CT CERVICAL SPINE WITHOUT CONTRAST TECHNIQUE: Multidetector CT imaging of the cervical spine was performed without intravenous contrast. Multiplanar CT image reconstructions were also generated. COMPARISON:  None. FINDINGS: Alignment: Alignment is anatomic. Skull base and vertebrae: No acute fracture. No primary bone lesion or focal pathologic process. Soft tissues and spinal canal: No prevertebral fluid or swelling. No visible canal hematoma. Disc levels: Mild C5-6 spondylosis. Prominent facet hypertrophic changes at the cervicothoracic junction. Upper chest: Airway is patent.  Lung apices are clear. Other: Reconstructed images demonstrate no additional  findings. IMPRESSION: 1. No acute cervical spine fracture. Electronically Signed   By: Sharlet Salina M.D.   On: 04/12/2021 21:34   DG Hip Unilat W or Wo Pelvis 2-3 Views Right  Result Date: 04/12/2021 CLINICAL DATA:  Larey Seat, right hip pain EXAM: DG HIP (WITH OR WITHOUT PELVIS) 2-3V RIGHT COMPARISON:  08/20/2020 FINDINGS: Frontal view of the pelvis as well as frontal and frogleg lateral views of the right hip are obtained. No acute fracture, subluxation, or dislocation. Joint spaces are well preserved. Soft tissues are unremarkable. IMPRESSION: 1. Unremarkable pelvis and right hip. Electronically Signed   By: Sharlet Salina M.D.   On: 04/12/2021 23:11    ____________________________________________   PROCEDURES  Procedure(s) performed (including Critical Care):  Procedures   ____________________________________________   INITIAL IMPRESSION / ASSESSMENT AND PLAN / ED COURSE ----------------------------------------- 12:01 AM on 04/13/2021 ----------------------------------------- Patient doing well on discharge.  I have told her about pendulum and walking her hand up the wall to avoid frozen shoulder.  She has a sling.  She has some pain meds if she needs them.  She will follow-up with either Dr. Odis Luster or her husband's orthopedic surgeon.  She will return for any further problems.               ____________________________________________   FINAL CLINICAL IMPRESSION(S) / ED DIAGNOSES  Final diagnoses:  Fall, initial encounter  Closed fracture of right scapula, unspecified part of scapula, initial encounter  Contusion of right hip, initial encounter     ED Discharge Orders          Ordered    Sling        04/12/21 2324    HYDROcodone-acetaminophen (NORCO/VICODIN) 5-325 MG tablet  Every 6 hours PRN        04/12/21 2324             Note:  This document was prepared using Dragon voice recognition software and may include unintentional dictation errors.     Arnaldo Natal, MD 04/13/21 0001

## 2022-04-15 IMAGING — CT CT CERVICAL SPINE W/O CM
3 of 4 series · 12 of 35 positions shown, 14 images · non-contrast
Comparison: None.

CLINICAL DATA: Fell, right scapular fracture

EXAM:
CT CERVICAL SPINE WITHOUT CONTRAST
TECHNIQUE: Multidetector CT imaging of the cervical spine was performed without
intravenous contrast. Multiplanar CT image reconstructions were also
generated.

[Series 4: sagittal bone · sagittal · 0.23mm/px · 5 of 78 slices shown, 6 images]
[im 26/78  bone]
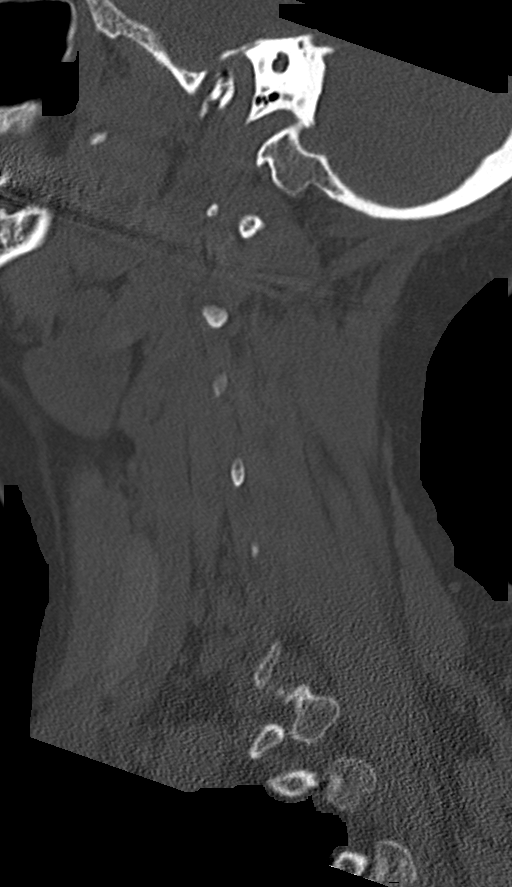
[im 33/78  bone]
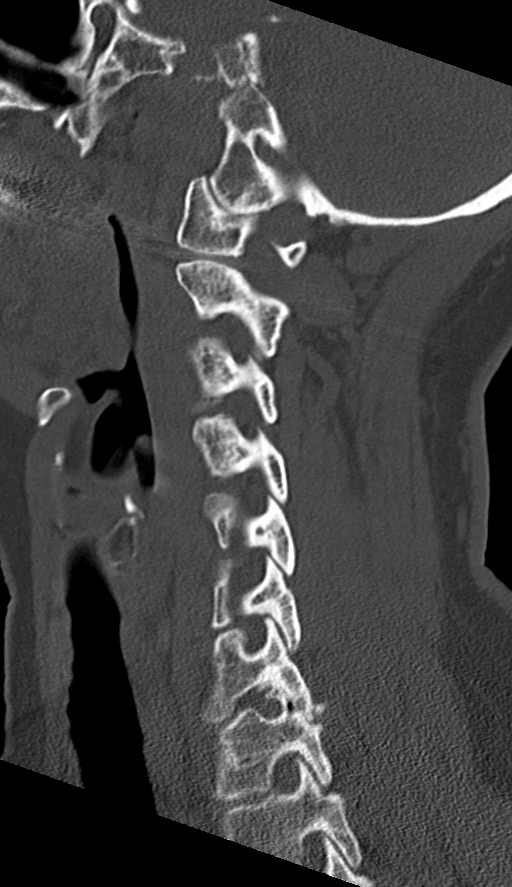
[im 39/78  soft-tissue]
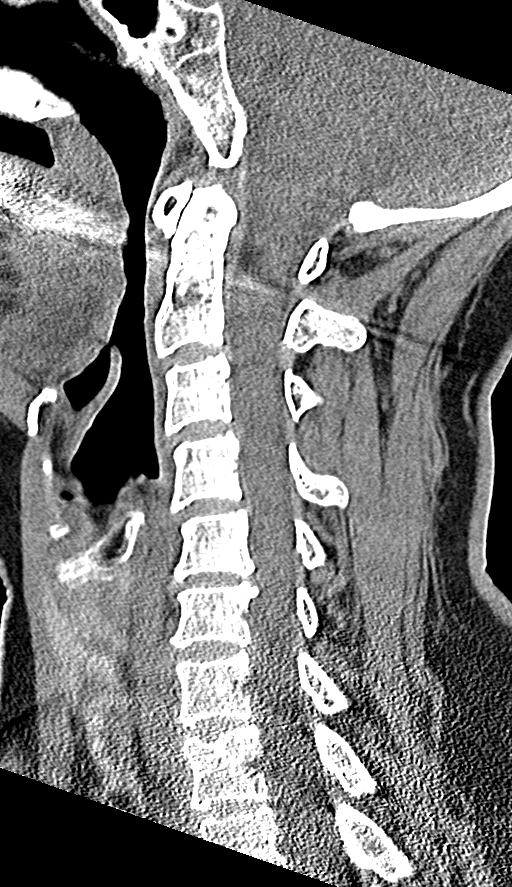
[im 39/78  bone]
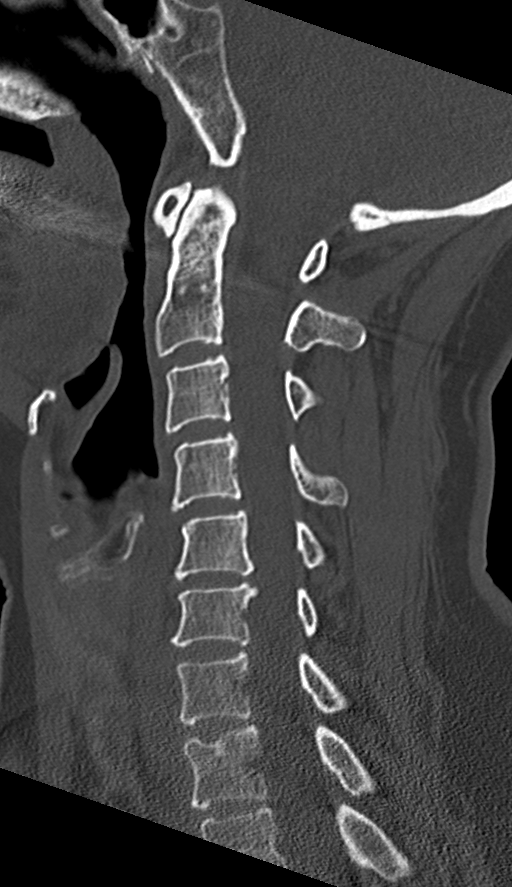
[im 45/78  bone]
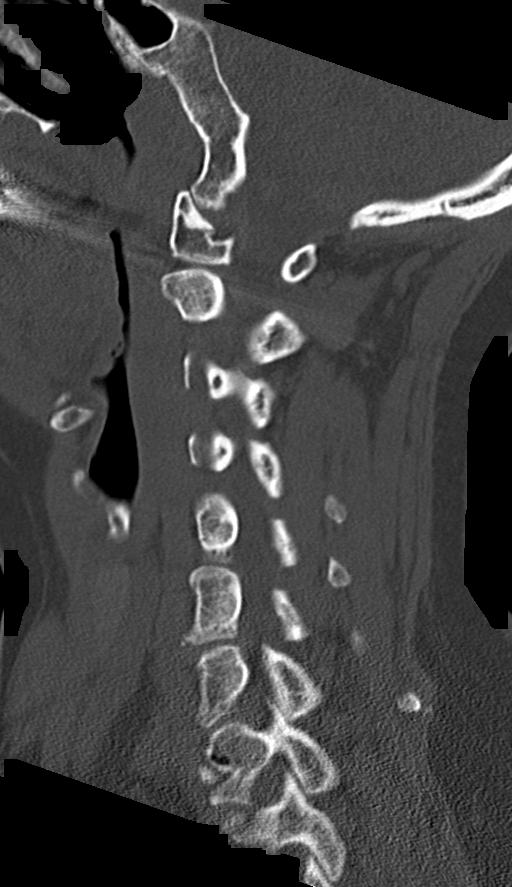
[im 52/78  bone]
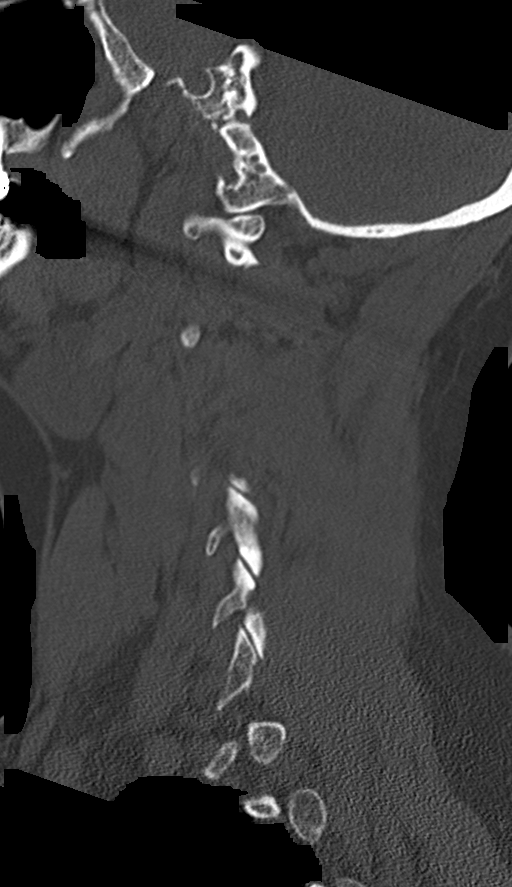

[Series 5: coronal bone · coronal · 0.26mm/px · 3 of 67 slices shown]
[im 14/67  bone]
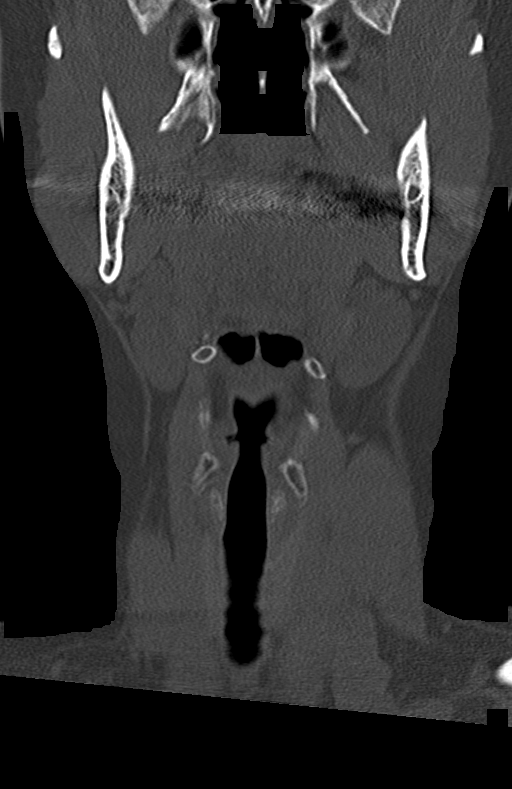
[im 27/67  bone]
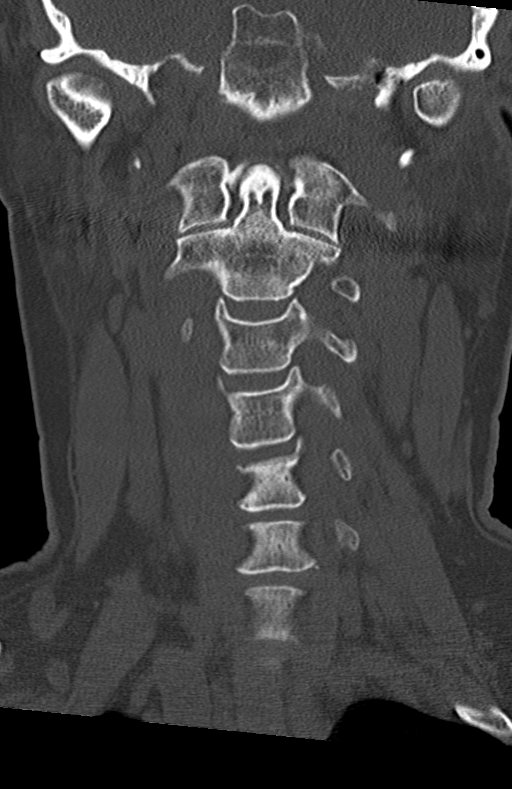
[im 40/67  bone]
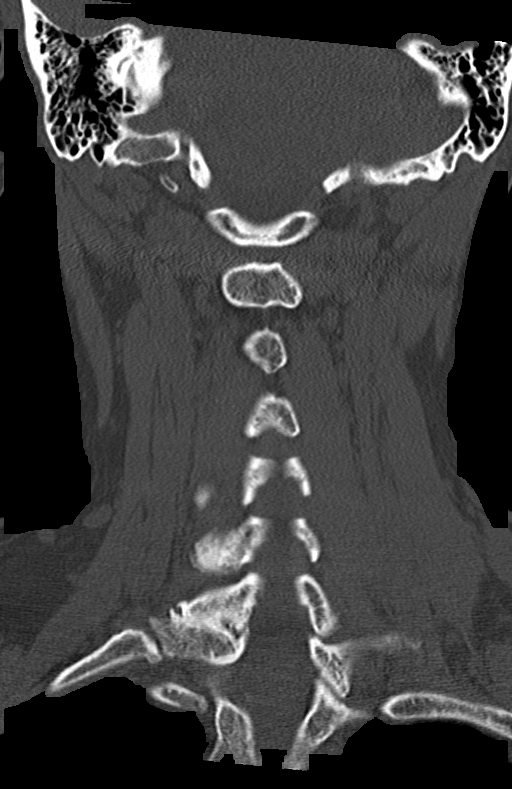

[Series 6: orthogonal bone · axial · 0.26mm/px · z∈[-231,-105]mm · 4 of 104 slices shown, 5 images]
[im 18/104  soft-tissue]
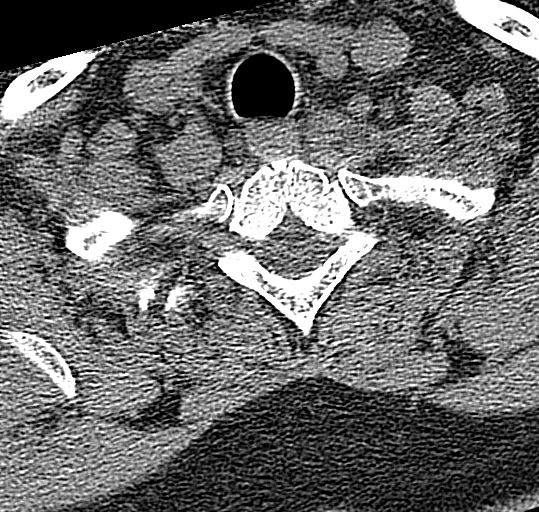
[im 18/104  bone]
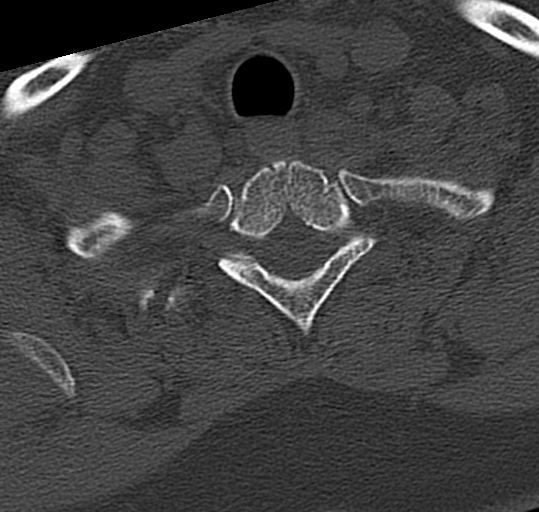
[im 35/104  bone]
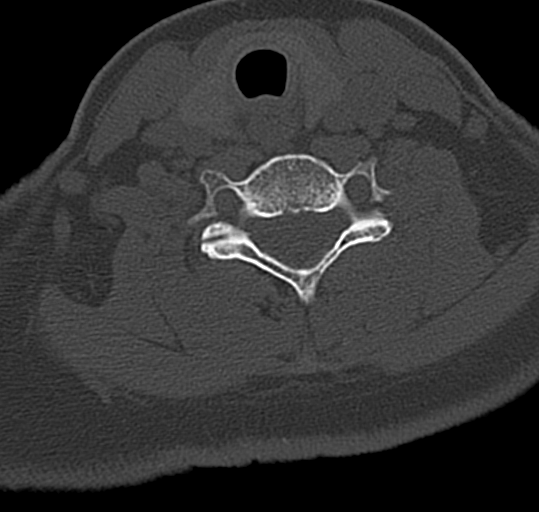
[im 69/104  bone]
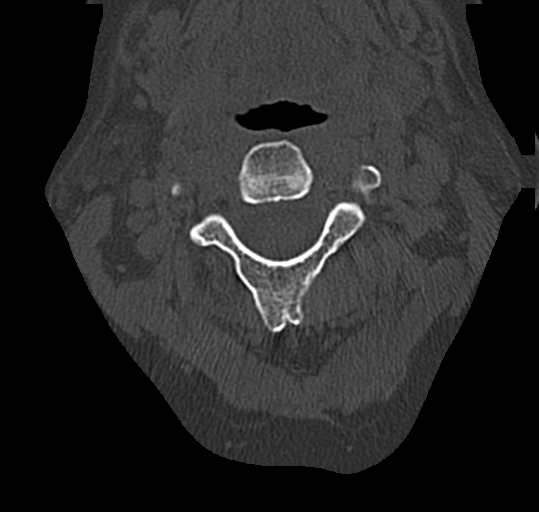
[im 86/104  bone]
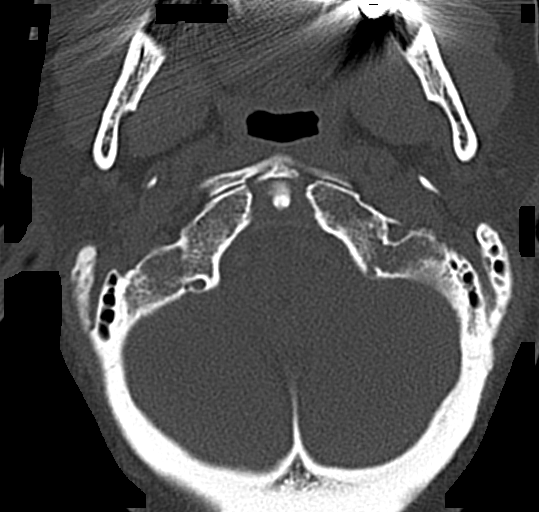

[12 of 35 positions shown; findings below may reference images not displayed]

FINDINGS: Alignment: Alignment is anatomic.

Skull base and vertebrae: No acute fracture. No primary bone lesion
or focal pathologic process.

Soft tissues and spinal canal: No prevertebral fluid or swelling. No
visible canal hematoma.

Disc levels: Mild C5-6 spondylosis. Prominent facet hypertrophic
changes at the cervicothoracic junction.

Upper chest: Airway is patent.  Lung apices are clear.

Other: Reconstructed images demonstrate no additional findings.
IMPRESSION: 1. No acute cervical spine fracture.

## 2022-06-13 ENCOUNTER — Ambulatory Visit
Admission: RE | Admit: 2022-06-13 | Discharge: 2022-06-13 | Disposition: A | Discharge status: Home / Self Care | Payer: Commercial Managed Care - HMO | Source: Ambulatory Visit

## 2022-06-13 VITALS — BP 174/85 | HR 98 | Temp 99.1°F | Resp 18 | Ht 65.0 in | Wt 164.0 lb

## 2022-06-13 DIAGNOSIS — M5441 Lumbago with sciatica, right side: Secondary | ICD-10-CM | POA: Diagnosis not present

## 2022-06-13 DIAGNOSIS — G8929 Other chronic pain: Secondary | ICD-10-CM

## 2022-06-13 DIAGNOSIS — M62838 Other muscle spasm: Secondary | ICD-10-CM

## 2022-06-13 DIAGNOSIS — E871 Hypo-osmolality and hyponatremia: Secondary | ICD-10-CM

## 2022-06-13 LAB — COMPREHENSIVE METABOLIC PANEL
ALT: 24 U/L (ref 0–44)
AST: 33 U/L (ref 15–41)
Albumin: 4.4 g/dL (ref 3.5–5.0)
Alkaline Phosphatase: 65 U/L (ref 38–126)
Anion gap: 9 (ref 5–15)
BUN: 9 mg/dL (ref 8–23)
CO2: 23 mmol/L (ref 22–32)
Calcium: 9.1 mg/dL (ref 8.9–10.3)
Chloride: 96 mmol/L — ABNORMAL LOW (ref 98–111)
Creatinine, Ser: 0.87 mg/dL (ref 0.44–1.00)
GFR, Estimated: >60 mL/min (ref >60)
Glucose, Bld: 156 mg/dL — ABNORMAL HIGH (ref 70–99)
Potassium: 3.7 mmol/L (ref 3.5–5.1)
Sodium: 128 mmol/L — ABNORMAL LOW (ref 135–145)
Total Bilirubin: 0.3 mg/dL (ref 0.3–1.2)
Total Protein: 7.9 g/dL (ref 6.5–8.1)

## 2022-06-13 MED ORDER — METHOCARBAMOL 750 MG PO TABS: 750.0000 mg | ORAL_TABLET | Freq: Three times a day (TID) | ORAL | 0 refills | Status: AC | PRN

## 2022-06-13 NOTE — Discharge Instructions (Addendum)
-  We will call you with the results of the metabolic panel which we will check your electrolytes, kidney function and liver function.  This will be later on today. - I have sent the muscle relaxer to the pharmacy to help with the cramps and spasms. - Hydrate well and relax/rest. - Follow-up with PCP or Ortho regarding her chronic back pain.

## 2022-06-13 NOTE — ED Triage Notes (Signed)
Pt c/o body aches and cramps x1week  Pt states that these happen when bending over or reaching for things.  Pt states that she has had a burning sensation going down her right leg and side.  Pt has used methocarbamol to help with the pain.

## 2022-06-13 NOTE — ED Provider Notes (Signed)
[facility]    CSN: [id number] Arrival date & time: 06/13/22  2409      History   Chief Complaint Chief Complaint  Patient presents with   Generalized Body Aches   Leg Pain    HPI Faith Huynh is a 66 y.o. female presenting for cramps of lower legs, neck and arms that been coming and going for the past 1 week.  She says that she had diarrhea about 2 weeks ago and symptoms started not feeling after that.  She says she has been trying to increase her fluids to see if that will help but it has not made much of a difference.  She reports a history of chronic back pain and disc issues.  States that she sees her chiropractor for it.  She says that she saw them a couple days ago and they "put everything back together" but the spasms have not improved.  She reports she had an old prescription of methocarbamol and took that and that relieved her cramps.  However, now she says that she is out of the medication.  She is denying any current neck pain or back pain.  She says that she sometimes feels pain in her right thigh and a burning sensation in her feet.  She has felt this before in relation to her back.  She has not any fevers or URI symptoms.  No other complaints.  HPI  Past Medical History:  Diagnosis Date   Asthma     There are no problems to display for this patient.   Past Surgical History:  Procedure Laterality Date   ABDOMINAL HYSTERECTOMY     APPENDECTOMY     TONSILLECTOMY      OB History   No obstetric history on file.      Home Medications    Prior to Admission medications   Medication Sig Start Date End Date Taking? Authorizing Provider  albuterol (PROVENTIL HFA;VENTOLIN HFA) 108 (90 Base) MCG/ACT inhaler Inhale into the lungs every 6 (six) hours as needed for wheezing or shortness of breath.   Yes [provider]  azelastine (ASTELIN) 0.1 % nasal spray Place into the nose. 07/17/21 07/17/22 Yes [provider]  budesonide (PULMICORT)  180 MCG/ACT inhaler Inhale into the lungs 2 (two) times daily.   Yes [provider]  esomeprazole (NEXIUM) 40 MG capsule Take by mouth. 06/12/22  Yes [provider]  estradiol (ESTRACE) 0.5 MG tablet Take 0.5 mg by mouth daily.   Yes [provider]  famotidine (PEPCID) 20 MG tablet Take by mouth.   Yes [provider]  fluticasone (FLONASE) 50 MCG/ACT nasal spray Place into both nostrils daily.   Yes [provider]  methocarbamol (ROBAXIN) 750 MG tablet Take 1 tablet (750 mg total) by mouth every 8 (eight) hours as needed for muscle spasms. 06/13/22  Yes Shirlee Latch, PA-C  SYMBICORT 80-4.5 MCG/ACT inhaler Inhale into the lungs. 01/24/22  Yes [provider]    Family History History reviewed. No pertinent family history.  Social History Social History   Tobacco Use   Smoking status: Never   Smokeless tobacco: Never  Vaping Use   Vaping Use: Never used  Substance Use Topics   Alcohol use: Yes    Comment: social   Drug use: No     Allergies   Biaxin [clarithromycin], Sulfa antibiotics, and Erythromycin   Review of Systems Review of Systems  Constitutional:  Negative for fatigue and fever.  HENT:  Negative for congestion.   Respiratory:  Negative for cough and shortness of breath.   Cardiovascular:  Negative for chest pain.  Musculoskeletal:  Positive for back pain (chronic), myalgias and neck pain.  Neurological:  Negative for weakness and numbness.     Physical Exam Triage Vital Signs ED Triage Vitals  Enc Vitals Group     BP 06/13/22 1009 (!) 174/85     Pulse Rate 06/13/22 1009 98     Resp 06/13/22 1009 18     Temp 06/13/22 1009 99.1 F (37.3 C)     Temp Source 06/13/22 1009 Oral     SpO2 06/13/22 1009 97 %     Weight 06/13/22 1006 164 lb (74.4 kg)     Height 06/13/22 1006 5\' 5"  (1.651 m)     Head Circumference --      Peak Flow --      Pain Score 06/13/22 1005 10     Pain Loc --      Pain Edu? --       Excl. in GC? --    No data found.  Updated Vital Signs BP (!) 174/85 (BP Location: Left Arm)   Pulse 98   Temp 99.1 F (37.3 C) (Oral)   Resp 18   Ht 5\' 5"  (1.651 m)   Wt 164 lb (74.4 kg)   SpO2 97%   BMI 27.29 kg/m      Physical Exam Vitals and nursing note reviewed.  Constitutional:      General: She is not in acute distress.    Appearance: Normal appearance. She is not ill-appearing or toxic-appearing.  HENT:     Head: Normocephalic and atraumatic.     Nose: Nose normal.     Mouth/Throat:     Mouth: Mucous membranes are moist.     Pharynx: Oropharynx is clear.  Eyes:     General: No scleral icterus.       Right eye: No discharge.        Left eye: No discharge.     Conjunctiva/sclera: Conjunctivae normal.  Cardiovascular:     Rate and Rhythm: Normal rate and regular rhythm.     Heart sounds: Normal heart sounds.  Pulmonary:     Effort: Pulmonary effort is normal. No respiratory distress.     Breath sounds: Normal breath sounds.  Musculoskeletal:     Cervical back: Normal range of motion and neck supple. No tenderness.     Lumbar back: Tenderness (right paralumbar muscles) present. No bony tenderness. Decreased range of motion. Negative right straight leg raise test and negative left straight leg raise test.  Skin:    General: Skin is dry.  Neurological:     General: No focal deficit present.     Mental Status: She is alert. Mental status is at baseline.     Motor: No weakness.     Gait: Gait normal.  Psychiatric:        Mood and Affect: Mood normal.        Behavior: Behavior normal.        Thought Content: Thought content normal.      UC Treatments / Results  Labs (all labs ordered are listed, but only abnormal results are displayed) Labs Reviewed  COMPREHENSIVE METABOLIC PANEL - Abnormal; Notable for the following components:      Result Value   Sodium 128 (*)    Chloride 96 (*)    Glucose, Bld 156 (*)    All other components  within normal  limits    EKG   Radiology No results found.  Procedures Procedures (including critical care time)  Medications Ordered in UC Medications - No data to display  Initial Impression / Assessment and Plan / UC Course  I have reviewed the triage vital signs and the nursing notes.  Pertinent labs & imaging results that were available during my care of the patient were reviewed by me and considered in my medical decision making (see chart for details).   66 year old female history of chronic back pain reports for 1 week history of muscle spasms of lower extremities, back, neck and arms.  Muscle spasms are greatest in the legs and back.  Reports that she has taken methocarbamol for this and it has helped.  She has run out of the medication.  She also reports diarrhea before onset of cramps.    BP elevated 174/85.  Other vitals normal and stable and she is overall well-appearing.  On exam she has no tenderness of her neck and forage motion of the neck.  She has mild tenderness of the right paralumbar muscles and reduced range of motion of her back.  Negative straight leg raise bilaterally.  CMP obtained to assess her electrolyte status.  Advised that we will contact her with the results.  Refill methocarbamol.  Advised to follow-up with PCP regarding chronic back pain.  ED precautions given.  CMP result received.  Sodium is 128.  Patient is unsure if she is ever had low sodium.  She says that she thought she was dehydrated so she has been drinking copious amounts of water.  We have discussed the symptoms of hyponatremia.  She at this time is not reporting any fatigue, low blood pressure, vomiting, confusion or weakness.  Her main complaint is some cramping.  I advised her to decrease her fluid intake and increase her dietary sodium.  She does not have a PCP at this time.  She says she plans to set 1 up in January once her insurance changes.  Advised her to return to our department on Saturday when  I am here to have her lab rechecked.  If her sodium is lower or the same or her symptoms are worse, advised her that she will need to go the emergency department.  I discussed all red flag signs and symptoms related to hyponatremia with her and advised her to go there sooner if any of those occur.  Patient is agreeable to plan.   Final Clinical Impressions(s) / UC Diagnoses   Final diagnoses:  Muscle spasm  Chronic bilateral low back pain with right-sided sciatica  Hyponatremia     Discharge Instructions      -We will call you with the results of the metabolic panel which we will check your electrolytes, kidney function and liver function.  This will be later on today. - I have sent the muscle relaxer to the pharmacy to help with the cramps and spasms. - Hydrate well and relax/rest. - Follow-up with PCP or Ortho regarding her chronic back pain.    ED Prescriptions     Medication Sig Dispense Auth. Provider   methocarbamol (ROBAXIN) 750 MG tablet Take 1 tablet (750 mg total) by mouth every 8 (eight) hours as needed for muscle spasms. 20 tablet Shirlee Latch, PA-C      I have reviewed the PDMP during this encounter.   Shirlee Latch, PA-C 06/13/22 1655

## 2022-06-15 ENCOUNTER — Ambulatory Visit
Admission: RE | Admit: 2022-06-15 | Discharge: 2022-06-15 | Disposition: A | Discharge status: Home / Self Care | Payer: Commercial Managed Care - HMO | Source: Ambulatory Visit | Attending: Physician Assistant | Admitting: Physician Assistant

## 2022-06-15 VITALS — BP 158/90 | HR 94 | Temp 98.3°F | Resp 16 | Ht 65.0 in | Wt 164.0 lb

## 2022-06-15 DIAGNOSIS — E871 Hypo-osmolality and hyponatremia: Secondary | ICD-10-CM

## 2022-06-15 DIAGNOSIS — R252 Cramp and spasm: Secondary | ICD-10-CM

## 2022-06-15 LAB — BASIC METABOLIC PANEL
Anion gap: 7 (ref 5–15)
BUN: 13 mg/dL (ref 8–23)
CO2: 28 mmol/L (ref 22–32)
Calcium: 9.7 mg/dL (ref 8.9–10.3)
Chloride: 97 mmol/L — ABNORMAL LOW (ref 98–111)
Creatinine, Ser: 0.86 mg/dL (ref 0.44–1.00)
GFR, Estimated: >60 mL/min (ref >60)
Glucose, Bld: 110 mg/dL — ABNORMAL HIGH (ref 70–99)
Potassium: 4.3 mmol/L (ref 3.5–5.1)
Sodium: 132 mmol/L — ABNORMAL LOW (ref 135–145)

## 2022-06-15 NOTE — ED Triage Notes (Signed)
Pt states that she was recently seen and told to return today. Pt states that she has some bilateral leg pain. X1 week

## 2022-06-15 NOTE — Discharge Instructions (Addendum)
-  Sodium is closer to normal now.  Do not over hydrate.  Add small amounts of sodium to your diet. - Make an appoint with a PCP when your insurance changes. - Go to ER if you develop fatigue, weakness, muscle cramping that worsens, confusion, nausea/vomiting, etc.

## 2022-06-15 NOTE — ED Provider Notes (Signed)
[facility]    CSN: [id number] Arrival date & time: 06/15/22  1003      History   Chief Complaint Chief Complaint  Patient presents with   Muscle Pain    Bilateral leg pain. X1 week    HPI Faith Huynh is a 66 y.o. female returning as instructed for muscle cramps and to have her sodium level rechecked.  She was seen here 2 days ago by me and her sodium level was found to be 128.  She had reported that she was increasing her fluid intake drastically since she had had diarrhea and thought she was dehydrated.  She has a history of chronic low back pain and pain into her right lower extremity.  Patient reports over the past couple days she has increased her dietary sodium and decreased her fluid intake a little bit.  She states her cramps have improved and she denies any confusion, fatigue, weakness, nausea/vomiting.  She says that she is only had to take the Robaxin, half a tablet 2-3 times.  She has no known history of hyponatremia.  She is not currently set up with a PCP but plans to do do so after January when her insurance changes.  No new complaints at this time.   HPI  Past Medical History:  Diagnosis Date   Asthma     There are no problems to display for this patient.   Past Surgical History:  Procedure Laterality Date   ABDOMINAL HYSTERECTOMY     APPENDECTOMY     TONSILLECTOMY      OB History   No obstetric history on file.      Home Medications    Prior to Admission medications   Medication Sig Start Date End Date Taking? Authorizing Provider  albuterol (PROVENTIL HFA;VENTOLIN HFA) 108 (90 Base) MCG/ACT inhaler Inhale into the lungs every 6 (six) hours as needed for wheezing or shortness of breath.   Yes [provider]  azelastine (ASTELIN) 0.1 % nasal spray Place into the nose. 07/17/21 07/17/22 Yes [provider]  budesonide (PULMICORT) 180 MCG/ACT inhaler Inhale into the lungs 2 (two) times daily.   Yes [provider]  esomeprazole (NEXIUM) 40 MG capsule Take by mouth. 06/12/22  Yes [provider]  estradiol (ESTRACE) 0.5 MG tablet Take 0.5 mg by mouth daily.   Yes [provider]  famotidine (PEPCID) 20 MG tablet Take by mouth.   Yes [provider]  fluticasone (FLONASE) 50 MCG/ACT nasal spray Place into both nostrils daily.   Yes [provider]  methocarbamol (ROBAXIN) 750 MG tablet Take 1 tablet (750 mg total) by mouth every 8 (eight) hours as needed for muscle spasms. 06/13/22  Yes Shirlee Latch, PA-C  SYMBICORT 80-4.5 MCG/ACT inhaler Inhale into the lungs. 01/24/22  Yes [provider]    Family History History reviewed. No pertinent family history.  Social History Social History   Tobacco Use   Smoking status: Never   Smokeless tobacco: Never  Vaping Use   Vaping Use: Never used  Substance Use Topics   Alcohol use: Yes    Comment: social   Drug use: No     Allergies   Biaxin [clarithromycin], Sulfa antibiotics, and Erythromycin   Review of Systems Review of Systems  Constitutional:  Negative for fatigue and fever.  HENT:  Negative for congestion.   Respiratory:  Negative for cough and shortness of breath.   Cardiovascular:  Negative for chest pain.  Musculoskeletal:  Positive for back pain (chronic), myalgias and neck pain.  Neurological:  Negative for weakness and numbness.     Physical Exam Triage Vital Signs ED Triage Vitals  Enc Vitals Group     BP 06/13/22 1009 (!) 174/85     Pulse Rate 06/13/22 1009 98     Resp 06/13/22 1009 18     Temp 06/13/22 1009 99.1 F (37.3 C)     Temp Source 06/13/22 1009 Oral     SpO2 06/13/22 1009 97 %     Weight 06/13/22 1006 164 lb (74.4 kg)     Height 06/13/22 1006 5\' 5"  (1.651 m)     Head Circumference --      Peak Flow --      Pain Score 06/13/22 1005 10     Pain Loc --      Pain Edu? --      Excl. in [place]? --    No data found.  Updated Vital Signs BP (!) 158/90 (BP  Location: Left Arm)   Pulse 94   Temp 98.3 F (36.8 C) (Oral)   Resp 16   Ht 5\' 5"  (1.651 m)   Wt 164 lb (74.4 kg)   SpO2 96%   BMI 27.29 kg/m      Physical Exam Vitals and nursing note reviewed.  Constitutional:      General: She is not in acute distress.    Appearance: Normal appearance. She is not ill-appearing or toxic-appearing.  HENT:     Head: Normocephalic and atraumatic.     Nose: Nose normal.     Mouth/Throat:     Mouth: Mucous membranes are moist.     Pharynx: Oropharynx is clear.  Eyes:     General: No scleral icterus.       Right eye: No discharge.        Left eye: No discharge.     Conjunctiva/sclera: Conjunctivae normal.  Cardiovascular:     Rate and Rhythm: Normal rate and regular rhythm.     Heart sounds: Normal heart sounds.  Pulmonary:     Effort: Pulmonary effort is normal. No respiratory distress.     Breath sounds: Normal breath sounds.  Musculoskeletal:     Cervical back: Normal range of motion and neck supple. No tenderness.     Lumbar back: Tenderness (right paralumbar muscles) present. No bony tenderness. Decreased range of motion. Negative right straight leg raise test and negative left straight leg raise test.  Skin:    General: Skin is dry.  Neurological:     General: No focal deficit present.     Mental Status: She is alert. Mental status is at baseline.     Motor: No weakness.     Gait: Gait normal.  Psychiatric:        Mood and Affect: Mood normal.        Behavior: Behavior normal.        Thought Content: Thought content normal.      UC Treatments / Results  Labs (all labs ordered are listed, but only abnormal results are displayed) Labs Reviewed  BASIC METABOLIC PANEL - Abnormal; Notable for the following components:      Result Value   Sodium 132 (*)    Chloride 97 (*)    Glucose, Bld 110 (*)    All other components within normal limits    EKG   Radiology No results found.  Procedures Procedures (including  critical care time)  Medications Ordered in  UC Medications - No data to display  Initial Impression / Assessment and Plan / UC Course  I have reviewed the triage vital signs and the nursing notes.  Pertinent labs & imaging results that were available during my care of the patient were reviewed by me and considered in my medical decision making (see chart for details).   66 year old female history of chronic back pain reports for 1 week history of muscle spasms of lower extremities, back, neck and arms.  Muscle spasms are greatest in the legs and back.  Patient seen here 2 days ago for these complaints.  Sodium level was found to be 128 on her CMP.  BP elevated 158/90.  Reports history of whitecoat hypertension.  Other vitals normal and stable and she is overall well-appearing.  On exam she has no tenderness of her neck and forage motion of the neck.  She has mild tenderness of the right paralumbar muscles and reduced range of motion of her back.  Negative straight leg raise bilaterally.  BMP obtained to assess her electrolyte status.    BMP result received.  Sodium is 132 currently.  Reviewed result with patient.  Advised her not to add copious amounts of salt to her diet.  Advised her to increase it slightly.  She is now in a more mild range for hyponatremia.  Encouraged her not to over hydrate.  Advised her to make an appointment with a PCP once her insurance changes.  Thoroughly reviewed precautions of hyponatremia with her including going to emergency department if she develops fatigue, weakness, increased muscle cramping or pain, confusion, nausea/vomiting, etc.  Advised her to take the methocarbamol as needed.  Follow-up here as needed.  She declines an AVS.    Final Clinical Impressions(s) / UC Diagnoses   Final diagnoses:  Hyponatremia  Muscle cramps     Discharge Instructions      -Sodium is closer to normal now.  Do not over hydrate.  Add small amounts of sodium to your  diet. - Make an appoint with a PCP when your insurance changes. - Go to ER if you develop fatigue, weakness, muscle cramping that worsens, confusion, nausea/vomiting, etc.      ED Prescriptions   None    PDMP not reviewed this encounter.     Shirlee Latch, PA-C 06/15/22 1118

## 2022-08-06 ENCOUNTER — Ambulatory Visit
Admission: RE | Admit: 2022-08-06 | Discharge: 2022-08-06 | Disposition: A | Discharge status: Home / Self Care | Payer: 59 | Source: Ambulatory Visit | Attending: Internal Medicine | Admitting: Internal Medicine

## 2022-08-06 VITALS — BP 151/105 | HR 102 | Temp 98.9°F | Resp 16 | Ht 65.0 in | Wt 165.0 lb

## 2022-08-06 DIAGNOSIS — J069 Acute upper respiratory infection, unspecified: Secondary | ICD-10-CM

## 2022-08-06 LAB — URINALYSIS, ROUTINE W REFLEX MICROSCOPIC
Bilirubin Urine: NEGATIVE
Glucose, UA: NEGATIVE mg/dL
Hgb urine dipstick: NEGATIVE
Ketones, ur: NEGATIVE mg/dL
Leukocytes,Ua: NEGATIVE
Nitrite: NEGATIVE
Protein, ur: NEGATIVE mg/dL
Specific Gravity, Urine: 1.01 (ref 1.005–1.030)
pH: 7 (ref 5.0–8.0)

## 2022-08-06 MED ORDER — PROMETHAZINE-DM 6.25-15 MG/5ML PO SYRP: 5.0000 mL | ORAL_SOLUTION | Freq: Four times a day (QID) | ORAL | 0 refills | Status: DC | PRN

## 2022-08-06 MED ORDER — BENZONATATE 100 MG PO CAPS: 200.0000 mg | ORAL_CAPSULE | Freq: Three times a day (TID) | ORAL | 0 refills | Status: DC

## 2022-08-06 NOTE — Discharge Instructions (Signed)
Continue your prescribed nasal spray and inhalers as previously prescribed.  Perform sinus irrigation to help alleviate mucous burden and nasal congestion.  Use the Tessalon Perles every 8 hours during the day.  Take them with a small sip of water.  They may give you some numbness to the base of your tongue or a metallic taste in your mouth, this is normal.  Use the Promethazine DM cough syrup at bedtime for cough and congestion.  It will make you drowsy so do not take it during the day.  Return for reevaluation or see your primary care provider for any new or worsening symptoms.

## 2022-08-06 NOTE — ED Triage Notes (Signed)
Pt c/o urinary frequency,dark colored urine, congestion & L ear pain x7 days.

## 2022-08-06 NOTE — ED Provider Notes (Signed)
[facility]    CSN: [id number] Arrival date & time: 08/06/22  1409      History   Chief Complaint Chief Complaint  Patient presents with   Appointment        Urinary Tract Infection    HPI Faith Huynh is a 67 y.o. female.   HPI  67 year old female here for evaluation of respiratory and GU complaints.  The patient reports that for the last week she has been experiencing nasal congestion with clear nasal discharge, left ear pain, fever, productive cough for clear sputum, shortness of breath and wheezing, urinary urgency and frequency, and cloudiness to her urine.  She denies any pain with urination or blood in her urine.  She does have a history of asthma and saw her pulmonologist 4 days ago and was told that her lungs were clear.  Past Medical History:  Diagnosis Date   Asthma     There are no problems to display for this patient.   Past Surgical History:  Procedure Laterality Date   ABDOMINAL HYSTERECTOMY     APPENDECTOMY     TONSILLECTOMY      OB History   No obstetric history on file.      Home Medications    Prior to Admission medications   Medication Sig Start Date End Date Taking? Authorizing Provider  albuterol (PROVENTIL HFA;VENTOLIN HFA) 108 (90 Base) MCG/ACT inhaler Inhale into the lungs every 6 (six) hours as needed for wheezing or shortness of breath.   Yes [provider]  azelastine (ASTELIN) 0.1 % nasal spray Place into the nose. 07/17/21 08/06/22 Yes [provider]  benzonatate (TESSALON) 100 MG capsule Take 2 capsules (200 mg total) by mouth every 8 (eight) hours. 08/06/22  Yes Margarette Canada, NP  budesonide (PULMICORT) 180 MCG/ACT inhaler Inhale into the lungs 2 (two) times daily.   Yes [provider]  esomeprazole (NEXIUM) 40 MG capsule Take by mouth. 06/12/22  Yes [provider]  estradiol (ESTRACE) 0.5 MG tablet Take 0.5 mg by mouth daily.   Yes [provider]  famotidine (PEPCID) 20  MG tablet Take by mouth.   Yes [provider]  fluticasone (FLONASE) 50 MCG/ACT nasal spray Place into both nostrils daily.   Yes [provider]  methocarbamol (ROBAXIN) 750 MG tablet Take 1 tablet (750 mg total) by mouth every 8 (eight) hours as needed for muscle spasms. 06/13/22  Yes Danton Clap, PA-C  promethazine-dextromethorphan (PROMETHAZINE-DM) 6.25-15 MG/5ML syrup Take 5 mLs by mouth 4 (four) times daily as needed. 08/06/22  Yes Margarette Canada, NP  SYMBICORT 80-4.5 MCG/ACT inhaler Inhale into the lungs. 01/24/22  Yes [provider]    Family History History reviewed. No pertinent family history.  Social History Social History   Tobacco Use   Smoking status: Never   Smokeless tobacco: Never  Vaping Use   Vaping Use: Never used  Substance Use Topics   Alcohol use: Yes    Comment: social   Drug use: No     Allergies   Biaxin [clarithromycin], Sulfa antibiotics, and Erythromycin   Review of Systems Review of Systems  Constitutional:  Positive for fever.  HENT:  Positive for congestion, ear pain and rhinorrhea. Negative for sore throat.   Respiratory:  Positive for cough, shortness of breath and wheezing.   Genitourinary:  Positive for frequency and urgency. Negative for dysuria and hematuria.     Physical Exam Triage Vital Signs ED Triage Vitals [08/06/22 1434]  Enc  Vitals Group     BP      Pulse      Resp      Temp      Temp src      SpO2      Weight 165 lb (74.8 kg)     Height 5\' 5"  (1.651 m)     Head Circumference      Peak Flow      Pain Score 6     Pain Loc      Pain Edu?      Excl. in GC?    No data found.  Updated Vital Signs BP (!) 151/105 (BP Location: Left Arm)   Pulse (!) 102   Temp 98.9 F (37.2 C) (Oral)   Resp 16   Ht 5\' 5"  (1.651 m)   Wt 165 lb (74.8 kg)   SpO2 96%   BMI 27.46 kg/m   Visual Acuity Right Eye Distance:   Left Eye Distance:   Bilateral Distance:    Right Eye Near:   Left Eye  Near:    Bilateral Near:     Physical Exam Vitals and nursing note reviewed.  Constitutional:      Appearance: Normal appearance. She is not ill-appearing.  HENT:     Head: Normocephalic and atraumatic.     Right Ear: Tympanic membrane, ear canal and external ear normal. There is no impacted cerumen.     Left Ear: Tympanic membrane, ear canal and external ear normal. There is no impacted cerumen.     Nose: Congestion and rhinorrhea present.     Comments: Nasal mucosa is mildly erythematous and edematous with clear discharge in both nares.    Mouth/Throat:     Mouth: Mucous membranes are moist.     Pharynx: Oropharynx is clear. Posterior oropharyngeal erythema present. No oropharyngeal exudate.     Comments: Patient is mild erythema to her posterior oropharynx with clear postnasal drip. Cardiovascular:     Rate and Rhythm: Normal rate and regular rhythm.     Pulses: Normal pulses.     Heart sounds: Normal heart sounds. No murmur heard.    No friction rub. No gallop.  Pulmonary:     Effort: Pulmonary effort is normal.     Breath sounds: Normal breath sounds. No wheezing, rhonchi or rales.  Musculoskeletal:     Cervical back: Normal range of motion and neck supple.  Lymphadenopathy:     Cervical: No cervical adenopathy.  Skin:    General: Skin is warm and dry.     Capillary Refill: Capillary refill takes less than 2 seconds.     Findings: No erythema or rash.  Neurological:     General: No focal deficit present.     Mental Status: She is alert and oriented to person, place, and time.  Psychiatric:        Mood and Affect: Mood normal.        Behavior: Behavior normal.        Thought Content: Thought content normal.        Judgment: Judgment normal.      UC Treatments / Results  Labs (all labs ordered are listed, but only abnormal results are displayed) Labs Reviewed  URINALYSIS, ROUTINE W REFLEX MICROSCOPIC - Abnormal; Notable for the following components:      Result  Value   Color, Urine STRAW (*)    All other components within normal limits    EKG   Radiology No results found.  Procedures Procedures (including critical care time)  Medications Ordered in UC Medications - No data to display  Initial Impression / Assessment and Plan / UC Course  I have reviewed the triage vital signs and the nursing notes.  Pertinent labs & imaging results that were available during my care of the patient were reviewed by me and considered in my medical decision making (see chart for details).   Patient is a pleasant, nontoxic-appearing 67 year old female here for evaluation of respiratory and GU symptoms as outlined in HPI above.  She does have a history of asthma and states that she has been using her inhalers as prescribed.  She is also prescribed Flonase and azelastine nasal spray which she has been using help with the congestion.  She is having clear nasal discharge and also some planing of a significant mount of clear postnasal drip with mucus chelation in her throat.  She has had shortness of breath and wheezing despite use of her inhalers.  She saw her pulmonologist 5 days ago and was told that her lungs were clear and advised to continue using her inhalers as previously prescribed.  Urinalysis was collected at triage and some straw-colored urine but her specific gravity was normal and there is no evidence of infection.  She does have inflammation of her upper respiratory tree as evidenced by the inflamed nasal mucosa and posterior oropharyngeal erythema.  She had copious clear discharge in her nasal passages and in her posterior oropharynx.  I believe she has a viral respiratory infection.  Her symptoms been going for 7 days so she is outside the therapeutic window for either Paxlovid or Tamiflu.  I will not test her for COVID or influenza.  I will prescribe Tessalon Perles and Promethazine DM cough syrup that she can use for cough and congestion.  She should  continue to use her nasal spray and inhalers as previously prescribed.  I have also suggested that she perform sinus irrigation to help alleviate some of the postnasal drip and mucus burden in her nasal passages.  She should use distilled water and not tap water for this.  I have also advised her increase her oral fluid intake so that she thins her mucus out and makes it easier for her body to clear.  Return precautions reviewed.   Final Clinical Impressions(s) / UC Diagnoses   Final diagnoses:  Viral URI with cough     Discharge Instructions      Continue your prescribed nasal spray and inhalers as previously prescribed.  Perform sinus irrigation to help alleviate mucous burden and nasal congestion.  Use the Tessalon Perles every 8 hours during the day.  Take them with a small sip of water.  They may give you some numbness to the base of your tongue or a metallic taste in your mouth, this is normal.  Use the Promethazine DM cough syrup at bedtime for cough and congestion.  It will make you drowsy so do not take it during the day.  Return for reevaluation or see your primary care provider for any new or worsening symptoms.      ED Prescriptions     Medication Sig Dispense Auth. Provider   benzonatate (TESSALON) 100 MG capsule Take 2 capsules (200 mg total) by mouth every 8 (eight) hours. 21 capsule Becky Augusta, NP   promethazine-dextromethorphan (PROMETHAZINE-DM) 6.25-15 MG/5ML syrup Take 5 mLs by mouth 4 (four) times daily as needed. 118 mL Becky Augusta, NP      PDMP not  reviewed this encounter.   Becky Augusta, NP 08/06/22 1501

## 2022-08-22 ENCOUNTER — Ambulatory Visit
Admission: RE | Admit: 2022-08-22 | Discharge: 2022-08-22 | Disposition: A | Discharge status: Home / Self Care | Payer: 59 | Source: Ambulatory Visit | Attending: Emergency Medicine | Admitting: Emergency Medicine

## 2022-08-22 VITALS — BP 168/103 | HR 104 | Temp 97.8°F | Resp 16

## 2022-08-22 DIAGNOSIS — J069 Acute upper respiratory infection, unspecified: Secondary | ICD-10-CM | POA: Diagnosis not present

## 2022-08-22 MED ORDER — FLUCONAZOLE 150 MG PO TABS: 150.0000 mg | ORAL_TABLET | Freq: Every day | ORAL | 0 refills | Status: AC

## 2022-08-22 MED ORDER — AMOXICILLIN-POT CLAVULANATE 875-125 MG PO TABS: 1.0000 | ORAL_TABLET | Freq: Two times a day (BID) | ORAL | 0 refills | Status: DC

## 2022-08-22 MED ORDER — HYDROCODONE BIT-HOMATROP MBR 5-1.5 MG/5ML PO SOLN: 5.0000 mL | Freq: Two times a day (BID) | ORAL | 0 refills | Status: DC | PRN

## 2022-08-22 MED ORDER — PREDNISONE 20 MG PO TABS: 40.0000 mg | ORAL_TABLET | Freq: Every day | ORAL | 0 refills | Status: DC

## 2022-08-22 NOTE — ED Provider Notes (Signed)
[facility]    CSN: [id number] Arrival date & time: 08/22/22  1251      History   Chief Complaint Chief Complaint  Patient presents with   Cough    Congestionm, cough , colored phlegm - Entered by patient   Nasal Congestion    HPI Faith Huynh is a 67 y.o. female.   Patient presents for evaluation of persisting nasal congestion productive cough present for 3 weeks.  Symptoms worsening, sputum has changed in color from clear to Faith Huynh to green.  Experiencing shortness of breath and wheezing only after periods of coughing.  Endorses a fullness to the bilateral ears.  Had occurrence of a sore throat for 1 day, has resolved.  Has been using albuterol inhaler which has been helpful.  Was evaluated on 08/06/2022 in urgent care diagnosed with viral illness, given Tessalon and Promethazine DM which she endorses is minimally effective.  Tolerating food and liquids.  Has additionally attempted Advil Cold and Sinus.  History of asthma.  Non-smoker.  History of a tonsillectomy. Past Medical History:  Diagnosis Date   Asthma     There are no problems to display for this patient.   Past Surgical History:  Procedure Laterality Date   ABDOMINAL HYSTERECTOMY     APPENDECTOMY     TONSILLECTOMY      OB History   No obstetric history on file.      Home Medications    Prior to Admission medications   Medication Sig Start Date End Date Taking? Authorizing Provider  albuterol (PROVENTIL HFA;VENTOLIN HFA) 108 (90 Base) MCG/ACT inhaler Inhale into the lungs every 6 (six) hours as needed for wheezing or shortness of breath.    [provider]  azelastine (ASTELIN) 0.1 % nasal spray Place into the nose. 07/17/21 08/06/22  [provider]  benzonatate (TESSALON) 100 MG capsule Take 2 capsules (200 mg total) by mouth every 8 (eight) hours. 08/06/22   Becky Augusta, NP  budesonide (PULMICORT) 180 MCG/ACT inhaler Inhale into the lungs 2 (two) times daily.    [provider]  esomeprazole (NEXIUM) 40 MG capsule Take by mouth. 06/12/22   [provider]  estradiol (ESTRACE) 0.5 MG tablet Take 0.5 mg by mouth daily.    [provider]  famotidine (PEPCID) 20 MG tablet Take by mouth.    [provider]  fluticasone (FLONASE) 50 MCG/ACT nasal spray Place into both nostrils daily.    [provider]  methocarbamol (ROBAXIN) 750 MG tablet Take 1 tablet (750 mg total) by mouth every 8 (eight) hours as needed for muscle spasms. 06/13/22   Shirlee Latch, PA-C  promethazine-dextromethorphan (PROMETHAZINE-DM) 6.25-15 MG/5ML syrup Take 5 mLs by mouth 4 (four) times daily as needed. 08/06/22   Becky Augusta, NP  SYMBICORT 80-4.5 MCG/ACT inhaler Inhale into the lungs. 01/24/22   [provider]    Family History No family history on file.  Social History Social History   Tobacco Use   Smoking status: Never   Smokeless tobacco: Never  Vaping Use   Vaping Use: Never used  Substance Use Topics   Alcohol use: Yes    Comment: social   Drug use: No     Allergies   Formaldehyde, Biaxin [clarithromycin], Peanut-containing drug products, Strawberry extract, Sulfa antibiotics, and Erythromycin   Review of Systems Review of Systems  Constitutional: Negative.   HENT:  Positive for congestion, ear pain and sore throat. Negative for dental problem, drooling, ear discharge, facial swelling,  hearing loss, mouth sores, nosebleeds, postnasal drip, rhinorrhea, sinus pressure, sinus pain, sneezing, tinnitus, trouble swallowing and voice change.   Respiratory:  Positive for cough. Negative for apnea, choking, chest tightness, shortness of breath, wheezing and stridor.   Cardiovascular: Negative.   Skin: Negative.      Physical Exam Triage Vital Signs ED Triage Vitals  Enc Vitals Group     BP 08/22/22 1300 (!) 168/103     Pulse Rate 08/22/22 1300 (!) 104     Resp 08/22/22 1300 16     Temp 08/22/22 1300 97.8 F (36.6  C)     Temp Source 08/22/22 1300 Oral     SpO2 08/22/22 1300 97 %     Weight --      Height --      Head Circumference --      Peak Flow --      Pain Score 08/22/22 1259 0     Pain Loc --      Pain Edu? --      Excl. in [place]? --    No data found.  Updated Vital Signs BP (!) 168/103 (BP Location: Left Arm)   Pulse (!) 104   Temp 97.8 F (36.6 C) (Oral)   Resp 16   SpO2 97%   Visual Acuity Right Eye Distance:   Left Eye Distance:   Bilateral Distance:    Right Eye Near:   Left Eye Near:    Bilateral Near:     Physical Exam Constitutional:      Appearance: Normal appearance.  HENT:     Head: Normocephalic.     Right Ear: Tympanic membrane, ear canal and external ear normal.     Left Ear: Tympanic membrane, ear canal and external ear normal.     Nose: Congestion present. No rhinorrhea.     Mouth/Throat:     Mouth: Mucous membranes are moist.     Pharynx: No posterior oropharyngeal erythema.  Cardiovascular:     Rate and Rhythm: Normal rate and regular rhythm.     Pulses: Normal pulses.     Heart sounds: Normal heart sounds.  Pulmonary:     Effort: Pulmonary effort is normal.     Breath sounds: Normal breath sounds.  Skin:    General: Skin is warm and dry.  Neurological:     Mental Status: She is alert and oriented to person, place, and time. Mental status is at baseline.      UC Treatments / Results  Labs (all labs ordered are listed, but only abnormal results are displayed) Labs Reviewed - No data to display  EKG   Radiology No results found.  Procedures Procedures (including critical care time)  Medications Ordered in UC Medications - No data to display  Initial Impression / Assessment and Plan / UC Course  I have reviewed the triage vital signs and the nursing notes.  Pertinent labs & imaging results that were available during my care of the patient were reviewed by me and considered in my medical decision making (see chart for  details).  Acute upper respiratory infection  Vitals are stable and patient is in no signs of distress nor toxic appearing, O2 saturation 97% on room air and lungs are clear to auscultation, congested cough witnessed in office, discussed all findings with patient, symptoms have persisted for 3 weeks without signs of resolution we will provide bacterial coverage, Augmentin prescribed as well as prednisone and Hycodan for management of cough, recommended follow-up as  needed, may use additional over-the-counter medications as needed for support Final Clinical Impressions(s) / UC Diagnoses   Final diagnoses:  None   Discharge Instructions   None    ED Prescriptions   None    PDMP not reviewed this encounter.   Hans Eden, NP 08/22/22 [phone]

## 2022-08-22 NOTE — ED Triage Notes (Signed)
Pt was seen 08/06/22 for cough and congestion and is not better.

## 2022-08-22 NOTE — Discharge Instructions (Addendum)
Begin augmentin every morning and evening for 7 days May use Diflucan as needed  Begin prednisone every morning with food for 5 days to reduce inflammation to the airway which ideally should help calm your coughing  You may use Hycodan cough syrup every morning and every evening, just be mindful this medication may make you feel drowsy May use Tessalon and Delsym in addition to this as needed        You can take Tylenol and/or Ibuprofen as needed for fever reduction and pain relief.   For cough: honey 1/2 to 1 teaspoon (you can dilute the honey in water or another fluid).  You can also use guaifenesin and dextromethorphan for cough. You can use a humidifier for chest congestion and cough.  If you don't have a humidifier, you can sit in the bathroom with the hot shower running.      For sore throat: try warm salt water gargles, cepacol lozenges, throat spray, warm tea or water with lemon/honey, popsicles or ice, or OTC cold relief medicine for throat discomfort.   For congestion: take a daily anti-histamine like Zyrtec, Claritin, and a oral decongestant, such as pseudoephedrine.  You can also use Flonase 1-2 sprays in each nostril daily.   It is important to stay hydrated: drink plenty of fluids (water, gatorade/powerade/pedialyte, juices, or teas) to keep your throat moisturized and help further relieve irritation/discomfort.

## 2024-07-25 ENCOUNTER — Ambulatory Visit
Admission: RE | Admit: 2024-07-25 | Discharge: 2024-07-25 | Disposition: A | Discharge status: Home / Self Care | Payer: Self-pay | Source: Ambulatory Visit | Attending: Emergency Medicine | Admitting: Emergency Medicine

## 2024-07-25 VITALS — BP 180/107 | HR 89 | Temp 97.8°F | Resp 15 | Ht 65.0 in | Wt 168.2 lb

## 2024-07-25 DIAGNOSIS — R6889 Other general symptoms and signs: Secondary | ICD-10-CM | POA: Diagnosis not present

## 2024-07-25 DIAGNOSIS — J069 Acute upper respiratory infection, unspecified: Secondary | ICD-10-CM

## 2024-07-25 LAB — POCT INFLUENZA A/B
Influenza A, POC: NEGATIVE
Influenza B, POC: NEGATIVE

## 2024-07-25 MED ORDER — IPRATROPIUM BROMIDE 0.06 % NA SOLN: 2.0000 | Freq: Four times a day (QID) | NASAL | 12 refills | Status: AC

## 2024-07-25 MED ORDER — BENZONATATE 100 MG PO CAPS: 200.0000 mg | ORAL_CAPSULE | Freq: Three times a day (TID) | ORAL | 0 refills | Status: AC

## 2024-07-25 MED ORDER — PROMETHAZINE-DM 6.25-15 MG/5ML PO SYRP: 5.0000 mL | ORAL_SOLUTION | Freq: Four times a day (QID) | ORAL | 0 refills | Status: AC | PRN

## 2024-07-25 NOTE — Discharge Instructions (Addendum)
 Your flu testing today was negative.  I do believe that you have a viral respiratory infection causing your symptoms.  Continue to use over-the-counter Tylenol  and/or ibuprofen as needed for any fever or pain.  Use the Atrovent  nasal spray, 2 squirts in each nostril every 6 hours, as needed for runny nose and postnasal drip.  Use the Tessalon  Perles every 8 hours during the day.  Take them with a small sip of water.  They may give you some numbness to the base of your tongue or a metallic taste in your mouth, this is normal.  Use the Promethazine  DM cough syrup at bedtime for cough and congestion.  It will make you drowsy so do not take it during the day.  Return for reevaluation or see your primary care provider for any new or worsening symptoms.

## 2024-07-25 NOTE — ED Provider Notes (Signed)
 " [facility]    CSN: [id number] Arrival date & time: 07/25/24  1326      History   Chief Complaint Chief Complaint  Patient presents with   Nasal Congestion    Appointment   Cough    HPI Faith Huynh is a 68 y.o. female.   HPI  68 old female with past medical history significant for asthma presents for evaluation of headache, chills, runny nose, nasal congestion, and cough for the past 2 days.  She also has had shortness of breath and wheezing which has been responding to her albuterol inhaler.  No fever, known sick contacts, or recent travel out of the state or country.  Past Medical History:  Diagnosis Date   Asthma     There are no active problems to display for this patient.   Past Surgical History:  Procedure Laterality Date   ABDOMINAL HYSTERECTOMY     APPENDECTOMY     TONSILLECTOMY      OB History   No obstetric history on file.      Home Medications    Prior to Admission medications  Medication Sig Start Date End Date Taking? Authorizing Provider  benzonatate  (TESSALON ) 100 MG capsule Take 2 capsules (200 mg total) by mouth every 8 (eight) hours. 07/25/24  Yes Bernardino Ditch, NP  ipratropium (ATROVENT ) 0.06 % nasal spray Place 2 sprays into both nostrils 4 (four) times daily. 07/25/24  Yes Bernardino Ditch, NP  promethazine -dextromethorphan (PROMETHAZINE -DM) 6.25-15 MG/5ML syrup Take 5 mLs by mouth 4 (four) times daily as needed. 07/25/24  Yes Bernardino Ditch, NP  albuterol (PROVENTIL HFA;VENTOLIN HFA) 108 (90 Base) MCG/ACT inhaler Inhale into the lungs every 6 (six) hours as needed for wheezing or shortness of breath.    [provider]  azelastine (ASTELIN) 0.1 % nasal spray Place into the nose. 07/17/21 08/06/22  [provider]  budesonide (PULMICORT) 180 MCG/ACT inhaler Inhale into the lungs 2 (two) times daily.    [provider]  esomeprazole (NEXIUM) 40 MG capsule Take by mouth. 06/12/22   [provider]   estradiol (ESTRACE) 0.5 MG tablet Take 0.5 mg by mouth daily.    [provider]  famotidine (PEPCID) 20 MG tablet Take by mouth.    [provider]  fluticasone (FLONASE) 50 MCG/ACT nasal spray Place into both nostrils daily.    [provider]  methocarbamol  (ROBAXIN ) 750 MG tablet Take 1 tablet (750 mg total) by mouth every 8 (eight) hours as needed for muscle spasms. 06/13/22   Arvis Jolan NOVAK, PA-C  SYMBICORT 80-4.5 MCG/ACT inhaler Inhale into the lungs. 01/24/22   [provider]    Family History History reviewed. No pertinent family history.  Social History Social History[1]   Allergies   Formaldehyde, Biaxin [clarithromycin], Peanut-containing drug products, Strawberry extract, Sulfa antibiotics, and Erythromycin   Review of Systems Review of Systems  Constitutional:  Negative for fever.  HENT:  Positive for congestion, ear pain and rhinorrhea. Negative for sore throat.   Respiratory:  Positive for cough, shortness of breath and wheezing.   Neurological:  Positive for headaches.     Physical Exam Triage Vital Signs ED Triage Vitals  Encounter Vitals Group     BP      Girls Systolic BP Percentile      Girls Diastolic BP Percentile      Boys Systolic BP Percentile      Boys Diastolic BP Percentile      Pulse  Resp      Temp      Temp src      SpO2      Weight      Height      Head Circumference      Peak Flow      Pain Score      Pain Loc      Pain Education      Exclude from Growth Chart    No data found.  Updated Vital Signs BP (!) 180/107 (BP Location: Left Arm)   Pulse 89   Temp 97.8 F (36.6 C) (Oral)   Resp 15   Ht 5' 5 (1.651 m)   Wt 168 lb 3.2 oz (76.3 kg)   SpO2 95%   BMI 27.99 kg/m   Visual Acuity Right Eye Distance:   Left Eye Distance:   Bilateral Distance:    Right Eye Near:   Left Eye Near:    Bilateral Near:     Physical Exam Vitals and nursing note reviewed.  Constitutional:       Appearance: Normal appearance. She is not ill-appearing.  HENT:     Head: Normocephalic and atraumatic.     Right Ear: Tympanic membrane, ear canal and external ear normal. There is no impacted cerumen.     Left Ear: Tympanic membrane, ear canal and external ear normal. There is no impacted cerumen.     Nose: Congestion and rhinorrhea present.     Comments: His mucosa is edematous erythematous with clear discharge in both there is    Mouth/Throat:     Mouth: Mucous membranes are moist.     Pharynx: Oropharynx is clear. No oropharyngeal exudate or posterior oropharyngeal erythema.  Cardiovascular:     Rate and Rhythm: Normal rate and regular rhythm.     Pulses: Normal pulses.     Heart sounds: Normal heart sounds. No murmur heard.    No friction rub. No gallop.  Pulmonary:     Effort: Pulmonary effort is normal.     Breath sounds: Normal breath sounds. No wheezing, rhonchi or rales.  Musculoskeletal:     Cervical back: Normal range of motion and neck supple. No tenderness.  Lymphadenopathy:     Cervical: No cervical adenopathy.  Skin:    General: Skin is warm and dry.     Capillary Refill: Capillary refill takes less than 2 seconds.     Findings: No rash.  Neurological:     General: No focal deficit present.     Mental Status: She is alert and oriented to person, place, and time.      UC Treatments / Results  Labs (all labs ordered are listed, but only abnormal results are displayed) Labs Reviewed  POCT INFLUENZA A/B - Normal    EKG   Radiology No results found.  Procedures Procedures (including critical care time)  Medications Ordered in UC Medications - No data to display  Initial Impression / Assessment and Plan / UC Course  I have reviewed the triage vital signs and the nursing notes.  Pertinent labs & imaging results that were available during my care of the patient were reviewed by me and considered in my medical decision making (see chart for  details).   Patient is a nontoxic-appearing 30 old female presenting for evaluation of 2 days worth of flulike symptoms as outlined in HPI above.  She reports that her nasal discharge and sputum production are both clear.  She has had some shortness of  breath and wheezing but not above baseline for her asthma.  It has been responding to her albuterol inhaler.  Differential diagnosis include influenza versus viral respiratory illness.  I will order a flu antigen test.  Influenza antigen test is negative.  I will discharge patient home with diagnosis of viral URI with cough.  Atrovent  nasal spray for nasal congestion and Tessalon  Perles and Promethazine  DM cough for cough and congestion.  Over-the-counter Tylenol  and/or ibuprofen as needed for fever or pain.   Final Clinical Impressions(s) / UC Diagnoses   Final diagnoses:  Influenza-like symptoms  Viral URI with cough     Discharge Instructions      Your flu testing today was negative.  I do believe that you have a viral respiratory infection causing your symptoms.  Continue to use over-the-counter Tylenol  and/or ibuprofen as needed for any fever or pain.  Use the Atrovent  nasal spray, 2 squirts in each nostril every 6 hours, as needed for runny nose and postnasal drip.  Use the Tessalon  Perles every 8 hours during the day.  Take them with a small sip of water.  They may give you some numbness to the base of your tongue or a metallic taste in your mouth, this is normal.  Use the Promethazine  DM cough syrup at bedtime for cough and congestion.  It will make you drowsy so do not take it during the day.  Return for reevaluation or see your primary care provider for any new or worsening symptoms.      ED Prescriptions     Medication Sig Dispense Auth. Provider   benzonatate  (TESSALON ) 100 MG capsule Take 2 capsules (200 mg total) by mouth every 8 (eight) hours. 21 capsule Bernardino Ditch, NP   ipratropium (ATROVENT ) 0.06 % nasal spray  Place 2 sprays into both nostrils 4 (four) times daily. 15 mL Bernardino Ditch, NP   promethazine -dextromethorphan (PROMETHAZINE -DM) 6.25-15 MG/5ML syrup Take 5 mLs by mouth 4 (four) times daily as needed. 118 mL Bernardino Ditch, NP      PDMP not reviewed this encounter.     [1]  Social History Tobacco Use   Smoking status: Never   Smokeless tobacco: Never  Vaping Use   Vaping status: Never Used  Substance Use Topics   Alcohol use: Yes    Comment: social   Drug use: No     Bernardino Ditch, NP 07/25/24 1433  "

## 2024-07-25 NOTE — ED Triage Notes (Signed)
 Patient c/o cough, runny nose, nasal congestion and chills and headache that started 2 days ago.  Patient denies fevers.
